# Patient Record
Sex: Male | Born: 1980 | Race: White | Hispanic: No | Marital: Single | Smoking: Former smoker
Health system: Southern US, Community
[De-identification: ages and names within clinical notes are randomized; demographics above are authoritative.]

## PROBLEM LIST (undated history)

## (undated) DIAGNOSIS — K219 Gastro-esophageal reflux disease without esophagitis: Secondary | ICD-10-CM

## (undated) HISTORY — DX: Gastro-esophageal reflux disease without esophagitis: K21.9

---

## 2013-12-02 HISTORY — PX: VAGINAL HYSTERECTOMY: SUR661

## 2014-06-22 DIAGNOSIS — N879 Dysplasia of cervix uteri, unspecified: Secondary | ICD-10-CM | POA: Insufficient documentation

## 2014-06-22 DIAGNOSIS — F649 Gender identity disorder, unspecified: Secondary | ICD-10-CM | POA: Insufficient documentation

## 2015-06-29 ENCOUNTER — Ambulatory Visit (INDEPENDENT_AMBULATORY_CARE_PROVIDER_SITE_OTHER): Payer: Self-pay | Admitting: Family Medicine

## 2015-06-29 ENCOUNTER — Encounter: Payer: Self-pay | Admitting: Family Medicine

## 2015-06-29 VITALS — BP 110/70 | HR 52 | Temp 98.1°F | Ht 66.0 in | Wt 152.0 lb

## 2015-06-29 DIAGNOSIS — H6501 Acute serous otitis media, right ear: Secondary | ICD-10-CM

## 2015-06-29 DIAGNOSIS — H6123 Impacted cerumen, bilateral: Secondary | ICD-10-CM

## 2015-06-29 DIAGNOSIS — H8309 Labyrinthitis, unspecified ear: Secondary | ICD-10-CM

## 2015-06-29 MED ORDER — AZITHROMYCIN 250 MG PO TABS: ORAL_TABLET | ORAL | Status: DC

## 2015-06-29 MED ORDER — MECLIZINE HCL 25 MG PO TABS: 25.0000 mg | ORAL_TABLET | Freq: Four times a day (QID) | ORAL | Status: AC

## 2015-06-29 NOTE — Progress Notes (Signed)
Name: Seth Conley   MRN: [id number]    DOB: 1981-06-30   Date:06/29/2015       Progress Note  Subjective  Chief Complaint  Chief Complaint  Patient presents with  . Dizziness    Dizziness This is a recurrent problem. The current episode started in the past 7 days. The problem occurs constantly. The problem has been unchanged. Associated symptoms include vertigo. Pertinent negatives include no abdominal pain, anorexia, arthralgias, change in bowel habit, chest pain, chills, congestion, coughing, diaphoresis, fatigue, fever, headaches, joint swelling, myalgias, nausea, neck pain, numbness, rash, sore throat, swollen glands, urinary symptoms, visual change, vomiting or weakness. The symptoms are aggravated by twisting and walking. He has tried nothing (otc dramanine) for the symptoms. The treatment provided mild (decreased nausea) relief.    No problem-specific assessment & plan notes found for this encounter.   No past medical history on file.  No past surgical history on file.  No family history on file.  History   Social History  . Marital Status: Unknown    Spouse Name: N/A  . Number of Children: N/A  . Years of Education: N/A   Occupational History  . Not on file.   Social History Main Topics  . Smoking status: Not on file  . Smokeless tobacco: Not on file  . Alcohol Use: Not on file  . Drug Use: Not on file  . Sexual Activity: Not on file   Other Topics Concern  . Not on file   Social History Narrative  . No narrative on file    Allergies  Allergen Reactions  . Amoxicillin Hives and Rash  . Hydrocodone Shortness Of Breath  . Sulfa Antibiotics Anaphylaxis  . Penicillins Rash     Review of Systems  Constitutional: Negative for fever, chills, weight loss, malaise/fatigue, diaphoresis and fatigue.  HENT: Negative for congestion, ear discharge, ear pain and sore throat.   Eyes: Negative for blurred vision.  Respiratory: Negative for cough, sputum  production, shortness of breath and wheezing.   Cardiovascular: Negative for chest pain, palpitations and leg swelling.  Gastrointestinal: Negative for heartburn, nausea, vomiting, abdominal pain, diarrhea, constipation, blood in stool, melena, anorexia and change in bowel habit.  Genitourinary: Negative for dysuria, urgency, frequency and hematuria.  Musculoskeletal: Negative for myalgias, back pain, joint pain, joint swelling, arthralgias and neck pain.  Skin: Negative for rash.  Neurological: Positive for dizziness and vertigo. Negative for tingling, sensory change, focal weakness, weakness, numbness and headaches.  Endo/Heme/Allergies: Negative for environmental allergies and polydipsia. Does not bruise/bleed easily.  Psychiatric/Behavioral: Negative for depression and suicidal ideas. The patient is not nervous/anxious and does not have insomnia.      Objective  Filed Vitals:   06/29/15 1129  BP: 110/70  Pulse: 52  Temp: 98.1 F (36.7 C)  TempSrc: Oral  Height:  (1.676 m)  Weight: 152 lb (68.947 kg)  SpO2: 99%    Physical Exam  Constitutional: He is oriented to person, place, and time and well-developed, well-nourished, and in no distress.  HENT:  Head: Normocephalic.  Right Ear: Hearing normal. There is tenderness. A foreign body is present. A middle ear effusion is present.  Left Ear: Hearing, tympanic membrane and external ear normal. A foreign body is present.  Nose: Nose normal.  Mouth/Throat: Oropharynx is clear and moist.  Eyes: Conjunctivae and EOM are normal. Pupils are equal, round, and reactive to light. Right eye exhibits no discharge. Left eye exhibits no discharge. No scleral icterus.  Neck:  Normal range of motion. Neck supple. No JVD present. No tracheal deviation present. No thyromegaly present.  Cardiovascular: Normal rate, regular rhythm, normal heart sounds and intact distal pulses.  Exam reveals no gallop and no friction rub.   No murmur  heard. Pulmonary/Chest: Breath sounds normal. No respiratory distress. He has no wheezes. He has no rales.  Abdominal: Soft. Bowel sounds are normal. He exhibits no mass. There is no hepatosplenomegaly. There is no tenderness. There is no rebound, no guarding and no CVA tenderness.  Musculoskeletal: Normal range of motion. He exhibits no edema or tenderness.  Lymphadenopathy:    He has no cervical adenopathy.  Neurological: He is alert and oriented to person, place, and time. He has normal sensation, normal strength, normal reflexes and intact cranial nerves. No cranial nerve deficit.  Skin: Skin is warm. No rash noted.  Psychiatric: Mood and affect normal.      Assessment & Plan  Problem List Items Addressed This Visit    None    Visit Diagnoses    Right acute serous otitis media, recurrence not specified    -  Primary    Relevant Medications    azithromycin (ZITHROMAX) 250 MG tablet    Labyrinthitis, unspecified laterality        Relevant Medications    meclizine (ANTIVERT) 25 MG tablet    Cerumen impaction, bilateral        irragated with results         Dr. Hayden [facility]  06/29/2015

## 2015-07-26 DIAGNOSIS — H81399 Other peripheral vertigo, unspecified ear: Secondary | ICD-10-CM | POA: Insufficient documentation

## 2015-09-22 ENCOUNTER — Other Ambulatory Visit: Payer: Self-pay

## 2015-10-05 ENCOUNTER — Encounter: Payer: Self-pay | Admitting: Family Medicine

## 2015-10-05 ENCOUNTER — Ambulatory Visit (INDEPENDENT_AMBULATORY_CARE_PROVIDER_SITE_OTHER): Payer: Self-pay | Admitting: Family Medicine

## 2015-10-05 VITALS — BP 120/70 | HR 68 | Ht 62.0 in | Wt 162.0 lb

## 2015-10-05 DIAGNOSIS — R69 Illness, unspecified: Secondary | ICD-10-CM

## 2015-10-05 DIAGNOSIS — Z79890 Hormone replacement therapy (postmenopausal): Secondary | ICD-10-CM

## 2015-10-05 LAB — GLUCOSE, POCT (MANUAL RESULT ENTRY): POC Glucose: 86 mg/dl (ref 70–99)

## 2015-10-05 MED ORDER — TESTOSTERONE CYPIONATE 100 MG/ML IM SOLN: 200.0000 mg | INTRAMUSCULAR | Status: DC

## 2015-10-05 NOTE — Progress Notes (Signed)
Name: Seth Conley   MRN: [id number]    DOB: 23-Jun-1981   Date:10/05/2015       Progress Note  Subjective  Chief Complaint  Chief Complaint  Patient presents with  . Annual Exam    F to M    Other This is a chronic (hormone replacement therpy) problem. The current episode started more than 1 year ago. The problem has been gradually improving. Pertinent negatives include no abdominal pain, anorexia, arthralgias, change in bowel habit, chest pain, chills, congestion, coughing, diaphoresis, fatigue, fever, headaches, joint swelling, myalgias, nausea, neck pain, numbness, rash, sore throat, swollen glands, urinary symptoms, vertigo, visual change, vomiting or weakness. Nothing aggravates the symptoms. Treatments tried: hormone replacement. The treatment provided moderate relief.    No problem-specific assessment & plan notes found for this encounter.   Past Medical History  Diagnosis Date  . GERD (gastroesophageal reflux disease)     Past Surgical History  Procedure Laterality Date  . Vaginal hysterectomy  2015    Family History  Problem Relation Age of Onset  . Heart disease Mother   . Cancer Sister   . Cancer Maternal Aunt   . Cancer Maternal Uncle     Social History   Social History  . Marital Status: Unknown    Spouse Name: N/A  . Number of Children: N/A  . Years of Education: N/A   Occupational History  . Not on file.   Social History Main Topics  . Smoking status: Former Games developer  . Smokeless tobacco: Not on file  . Alcohol Use: 0.0 oz/week    0 Standard drinks or equivalent per week  . Drug Use: No  . Sexual Activity: No   Other Topics Concern  . Not on file   Social History Narrative    Allergies  Allergen Reactions  . Amoxicillin Hives and Rash  . Hydrocodone Shortness Of Breath  . Sulfa Antibiotics Anaphylaxis  . Penicillins Rash     Review of Systems  Constitutional: Negative for fever, chills, weight loss, malaise/fatigue, diaphoresis and  fatigue.  HENT: Negative for congestion, ear discharge, ear pain and sore throat.        Headache resolved  Eyes: Negative for blurred vision.  Respiratory: Negative for cough, sputum production, shortness of breath and wheezing.   Cardiovascular: Negative for chest pain, palpitations and leg swelling.  Gastrointestinal: Negative for heartburn, nausea, vomiting, abdominal pain, diarrhea, constipation, blood in stool, melena, anorexia and change in bowel habit.  Genitourinary: Negative for dysuria, urgency, frequency and hematuria.  Musculoskeletal: Negative for myalgias, back pain, joint pain, joint swelling, arthralgias and neck pain.  Skin: Negative for rash.  Neurological: Negative for dizziness, vertigo, tingling, sensory change, focal weakness, weakness, numbness and headaches.  Endo/Heme/Allergies: Negative for environmental allergies and polydipsia. Does not bruise/bleed easily.  Psychiatric/Behavioral: Negative for depression and suicidal ideas. The patient is not nervous/anxious and does not have insomnia.      Objective  Filed Vitals:   10/05/15 1003  BP: 120/70  Pulse: 68  Height:  (1.575 m)  Weight: 162 lb (73.483 kg)    Physical Exam  Constitutional: He is oriented to person, place, and time and well-developed, well-nourished, and in no distress.  HENT:  Head: Normocephalic.  Right Ear: External ear normal.  Left Ear: External ear normal.  Nose: Nose normal.  Mouth/Throat: Oropharynx is clear and moist.  Eyes: Conjunctivae and EOM are normal. Pupils are equal, round, and reactive to light. Right eye exhibits no discharge. Left  eye exhibits no discharge. No scleral icterus.  Neck: Normal range of motion. Neck supple. No JVD present. No tracheal deviation present. No thyromegaly present.  Cardiovascular: Normal rate, regular rhythm, normal heart sounds and intact distal pulses.  Exam reveals no gallop and no friction rub.   No murmur heard. Pulmonary/Chest:  Breath sounds normal. No respiratory distress. He has no wheezes. He has no rales.  Abdominal: Soft. Bowel sounds are normal. He exhibits no mass. There is no hepatosplenomegaly. There is no tenderness. There is no rebound, no guarding and no CVA tenderness.  Musculoskeletal: Normal range of motion. He exhibits no edema or tenderness.  Lymphadenopathy:    He has no cervical adenopathy.  Neurological: He is alert and oriented to person, place, and time. He has normal sensation, normal strength, normal reflexes and intact cranial nerves. No cranial nerve deficit.  Skin: Skin is warm. No rash noted.  Psychiatric: Mood and affect normal.      Assessment & Plan  Problem List Items Addressed This Visit    None    Visit Diagnoses    Hormone replacement therapy (HRT)    -  Primary    Relevant Medications    testosterone cypionate (DEPOTESTOTERONE CYPIONATE) 100 MG/ML injection    Other Relevant Orders    AST    POCT Glucose (CBG)    Taking medication for chronic disease        Relevant Medications    testosterone cypionate (DEPOTESTOTERONE CYPIONATE) 100 MG/ML injection    Other Relevant Orders    AST    POCT Glucose (CBG)         Dr. Hayden Rasmusseneanna Aleksa [facility]  10/05/2015

## 2015-10-06 LAB — AST: AST: 18 IU/L (ref 0–40)

## 2016-02-06 ENCOUNTER — Encounter: Payer: Self-pay | Admitting: Family Medicine

## 2016-02-06 ENCOUNTER — Ambulatory Visit (INDEPENDENT_AMBULATORY_CARE_PROVIDER_SITE_OTHER): Payer: Self-pay | Admitting: Family Medicine

## 2016-02-06 VITALS — BP 100/72 | HR 78 | Ht 62.0 in | Wt 147.0 lb

## 2016-02-06 DIAGNOSIS — R69 Illness, unspecified: Secondary | ICD-10-CM

## 2016-02-06 DIAGNOSIS — Z79890 Hormone replacement therapy (postmenopausal): Secondary | ICD-10-CM

## 2016-02-06 MED ORDER — TESTOSTERONE CYPIONATE 100 MG/ML IM SOLN: 200.0000 mg | INTRAMUSCULAR | Status: DC

## 2016-02-06 NOTE — Progress Notes (Signed)
Name: Seth Conley   MRN: [id number]    DOB: 01/14/1981   Date:02/06/2016       Progress Note  Subjective  Chief Complaint  Chief Complaint  Patient presents with  . Hypogonadism    needs refill on testosterone    HPI Comments: Patient presents for hormone replacement therapy.     No problem-specific assessment & plan notes found for this encounter.   Past Medical History  Diagnosis Date  . GERD (gastroesophageal reflux disease)     Past Surgical History  Procedure Laterality Date  . Vaginal hysterectomy  2015    Family History  Problem Relation Age of Onset  . Heart disease Mother   . Cancer Sister   . Cancer Maternal Aunt   . Cancer Maternal Uncle     Social History   Social History  . Marital Status: Unknown    Spouse Name: N/A  . Number of Children: N/A  . Years of Education: N/A   Occupational History  . Not on file.   Social History Main Topics  . Smoking status: Former Games developer  . Smokeless tobacco: Not on file  . Alcohol Use: 0.0 oz/week    0 Standard drinks or equivalent per week  . Drug Use: No  . Sexual Activity: No   Other Topics Concern  . Not on file   Social History Narrative    Allergies  Allergen Reactions  . Amoxicillin Hives and Rash  . Hydrocodone Shortness Of Breath  . Sulfa Antibiotics Anaphylaxis  . Penicillins Rash     Review of Systems  Constitutional: Negative for fever, chills, weight loss and malaise/fatigue.  HENT: Negative for ear discharge, ear pain and sore throat.   Eyes: Negative for blurred vision.  Respiratory: Negative for cough, sputum production, shortness of breath and wheezing.   Cardiovascular: Negative for chest pain, palpitations and leg swelling.  Gastrointestinal: Negative for heartburn, nausea, abdominal pain, diarrhea, constipation, blood in stool and melena.  Genitourinary: Negative for dysuria, urgency, frequency and hematuria.  Musculoskeletal: Negative for myalgias, back pain, joint pain and  neck pain.  Skin: Negative for rash.  Neurological: Negative for dizziness, tingling, sensory change, focal weakness and headaches.  Endo/Heme/Allergies: Negative for environmental allergies and polydipsia. Does not bruise/bleed easily.  Psychiatric/Behavioral: Negative for depression and suicidal ideas. The patient is not nervous/anxious and does not have insomnia.      Objective  Filed Vitals:   02/06/16 1104  BP: 100/72  Pulse: 78  Height:  (1.575 m)  Weight: 147 lb (66.679 kg)    Physical Exam  Constitutional: He is oriented to person, place, and time and well-developed, well-nourished, and in no distress.  HENT:  Head: Normocephalic.  Right Ear: External ear normal.  Left Ear: External ear normal.  Nose: Nose normal.  Mouth/Throat: Oropharynx is clear and moist.  Eyes: Conjunctivae and EOM are normal. Pupils are equal, round, and reactive to light. Right eye exhibits no discharge. Left eye exhibits no discharge. No scleral icterus.  Neck: Normal range of motion. Neck supple. No JVD present. No tracheal deviation present. No thyromegaly present.  Cardiovascular: Normal rate, regular rhythm, normal heart sounds and intact distal pulses.  Exam reveals no gallop and no friction rub.   No murmur heard. Pulmonary/Chest: Breath sounds normal. No respiratory distress. He has no wheezes. He has no rales.  Abdominal: Soft. Bowel sounds are normal. He exhibits no mass. There is no hepatosplenomegaly. There is no tenderness. There is no rebound, no guarding  and no CVA tenderness.  Musculoskeletal: Normal range of motion. He exhibits no edema or tenderness.  Lymphadenopathy:    He has no cervical adenopathy.  Neurological: He is alert and oriented to person, place, and time. He has normal sensation, normal strength, normal reflexes and intact cranial nerves. No cranial nerve deficit.  Skin: Skin is warm. No rash noted.  Psychiatric: Mood and affect normal.  Nursing note and vitals  reviewed.     Assessment & Plan  Problem List Items Addressed This Visit    None    Visit Diagnoses    Hormone replacement therapy (HRT)    -  Primary    Relevant Medications    testosterone cypionate (DEPOTESTOTERONE CYPIONATE) 100 MG/ML injection    Taking medication for chronic disease        Relevant Medications    testosterone cypionate (DEPOTESTOTERONE CYPIONATE) 100 MG/ML injection         Dr. Hayden Rasmusseneanna Kiaan Overholser [facility]  02/06/2016

## 2016-02-07 ENCOUNTER — Ambulatory Visit: Payer: Self-pay | Admitting: Family Medicine

## 2016-08-20 ENCOUNTER — Other Ambulatory Visit: Payer: Self-pay

## 2017-01-03 ENCOUNTER — Ambulatory Visit (INDEPENDENT_AMBULATORY_CARE_PROVIDER_SITE_OTHER): Payer: BLUE CROSS/BLUE SHIELD | Admitting: Family Medicine

## 2017-01-03 ENCOUNTER — Ambulatory Visit
Admission: RE | Admit: 2017-01-03 | Discharge: 2017-01-03 | Disposition: A | Discharge status: Home / Self Care | Payer: BLUE CROSS/BLUE SHIELD | Source: Ambulatory Visit | Attending: Family Medicine | Admitting: Family Medicine

## 2017-01-03 VITALS — BP 100/62 | HR 50 | Ht 62.0 in | Wt 161.0 lb

## 2017-01-03 DIAGNOSIS — R69 Illness, unspecified: Secondary | ICD-10-CM

## 2017-01-03 DIAGNOSIS — E291 Testicular hypofunction: Secondary | ICD-10-CM | POA: Diagnosis not present

## 2017-01-03 DIAGNOSIS — M5020 Other cervical disc displacement, unspecified cervical region: Secondary | ICD-10-CM

## 2017-01-03 DIAGNOSIS — E782 Mixed hyperlipidemia: Secondary | ICD-10-CM | POA: Diagnosis not present

## 2017-01-03 DIAGNOSIS — M5410 Radiculopathy, site unspecified: Secondary | ICD-10-CM

## 2017-01-03 DIAGNOSIS — Z23 Encounter for immunization: Secondary | ICD-10-CM | POA: Diagnosis not present

## 2017-01-03 DIAGNOSIS — Z79890 Hormone replacement therapy (postmenopausal): Secondary | ICD-10-CM | POA: Diagnosis not present

## 2017-01-03 DIAGNOSIS — M5030 Other cervical disc degeneration, unspecified cervical region: Secondary | ICD-10-CM

## 2017-01-03 MED ORDER — TESTOSTERONE CYPIONATE 100 MG/ML IM SOLN: 200.0000 mg | INTRAMUSCULAR | 2 refills | Status: DC

## 2017-01-03 MED ORDER — OMEGA-3 1000 MG PO CAPS: 1.0000 | ORAL_CAPSULE | Freq: Two times a day (BID) | ORAL | 3 refills | Status: DC

## 2017-01-03 MED ORDER — ASPIRIN EC 81 MG PO TBEC: 81.0000 mg | DELAYED_RELEASE_TABLET | Freq: Every day | ORAL | 3 refills | Status: DC

## 2017-01-03 NOTE — Progress Notes (Signed)
Name: Seth Conley   MRN: [id number]    DOB: 1980/12/15   Date:01/03/2017       Progress Note  Subjective  Chief Complaint  Chief Complaint  Patient presents with  . hormone replacement therapy    Patient present for endocrine disorder.   Neck Pain   This is a chronic problem. The current episode started more than 1 year ago. The problem occurs intermittently. The problem has been waxing and waning. The pain is associated with a remote injury. The pain is present in the midline. The quality of the pain is described as aching. The pain is at a severity of 4/10. Pertinent negatives include no chest pain, fever, headaches, leg pain, numbness, pain with swallowing, paresis, photophobia, syncope, tingling, trouble swallowing, visual change, weakness or weight loss. He has tried nothing for the symptoms. The treatment provided no relief.    No problem-specific Assessment & Plan notes found for this encounter.   Past Medical History:  Diagnosis Date  . GERD (gastroesophageal reflux disease)     Past Surgical History:  Procedure Laterality Date  . VAGINAL HYSTERECTOMY  2015    Family History  Problem Relation Age of Onset  . Heart disease Mother   . Cancer Sister   . Cancer Maternal Aunt   . Cancer Maternal Uncle     Social History   Social History  . Marital status: Single    Spouse name: N/A  . Number of children: N/A  . Years of education: N/A   Occupational History  . Not on file.   Social History Main Topics  . Smoking status: Former Games developer  . Smokeless tobacco: Not on file  . Alcohol use 0.0 oz/week  . Drug use: No  . Sexual activity: No   Other Topics Concern  . Not on file   Social History Narrative  . No narrative on file    Allergies  Allergen Reactions  . Amoxicillin Hives and Rash  . Hydrocodone Shortness Of Breath  . Sulfa Antibiotics Anaphylaxis  . Clindamycin Hives  . Penicillins Rash     Review of Systems  Constitutional: Negative  for chills, fever, malaise/fatigue and weight loss.  HENT: Negative for ear discharge, ear pain, sore throat and trouble swallowing.   Eyes: Negative for blurred vision and photophobia.  Respiratory: Negative for cough, sputum production, shortness of breath and wheezing.   Cardiovascular: Negative for chest pain, palpitations, claudication, leg swelling and syncope.  Gastrointestinal: Negative for abdominal pain, blood in stool, constipation, diarrhea, heartburn, melena and nausea.  Genitourinary: Negative for dysuria, frequency, hematuria and urgency.  Musculoskeletal: Positive for neck pain. Negative for back pain, joint pain and myalgias.  Skin: Negative for rash.  Neurological: Negative for dizziness, tingling, tremors, sensory change, focal weakness, weakness, numbness and headaches.  Endo/Heme/Allergies: Negative for environmental allergies and polydipsia. Does not bruise/bleed easily.  Psychiatric/Behavioral: Negative for depression and suicidal ideas. The patient is not nervous/anxious and does not have insomnia.      Objective  Vitals:   01/03/17 0845  BP: 100/62  Pulse: (!) 50  Weight: 161 lb (73 kg)  Height: 5\' 2"  (1.575 m)    Physical Exam  Nursing note and vitals reviewed.     Assessment & Plan  Problem List Items Addressed This Visit    None    Visit Diagnoses    Hypogonadism in male    -  Primary   Relevant Orders   Testosterone   Cervical discogenic pain syndrome  Relevant Orders   DG Cervical Spine Complete   Radiculopathy affecting upper extremity       Hormone replacement therapy (HRT)       Relevant Medications   testosterone cypionate (DEPOTESTOTERONE CYPIONATE) 100 MG/ML injection   Taking medication for chronic disease       Relevant Medications   testosterone cypionate (DEPOTESTOTERONE CYPIONATE) 100 MG/ML injection   aspirin EC 81 MG tablet   Other Relevant Orders   Testosterone   Mixed hyperlipidemia       Relevant Medications    Omega-3 1000 MG CAPS   aspirin EC 81 MG tablet   Immunization due            Dr. Hayden Rasmusseneanna Cynthia Stainback [facility]  01/03/17

## 2017-01-04 LAB — TESTOSTERONE: Testosterone: 412 ng/dL (ref 264–916)

## 2017-01-06 ENCOUNTER — Other Ambulatory Visit: Payer: Self-pay

## 2017-01-23 DIAGNOSIS — M25562 Pain in left knee: Secondary | ICD-10-CM | POA: Insufficient documentation

## 2017-02-06 ENCOUNTER — Other Ambulatory Visit: Payer: Self-pay

## 2017-02-07 ENCOUNTER — Other Ambulatory Visit: Payer: Self-pay

## 2017-09-29 ENCOUNTER — Encounter: Payer: Self-pay | Admitting: Family Medicine

## 2017-09-29 ENCOUNTER — Ambulatory Visit (INDEPENDENT_AMBULATORY_CARE_PROVIDER_SITE_OTHER): Payer: BLUE CROSS/BLUE SHIELD | Admitting: Family Medicine

## 2017-09-29 VITALS — BP 120/70 | HR 68 | Ht 62.0 in | Wt 162.0 lb

## 2017-09-29 DIAGNOSIS — R69 Illness, unspecified: Secondary | ICD-10-CM | POA: Diagnosis not present

## 2017-09-29 DIAGNOSIS — E349 Endocrine disorder, unspecified: Secondary | ICD-10-CM

## 2017-09-29 DIAGNOSIS — Z79890 Hormone replacement therapy (postmenopausal): Secondary | ICD-10-CM

## 2017-09-29 DIAGNOSIS — Z23 Encounter for immunization: Secondary | ICD-10-CM | POA: Diagnosis not present

## 2017-09-29 MED ORDER — TESTOSTERONE CYPIONATE 100 MG/ML IM SOLN: 200.0000 mg | INTRAMUSCULAR | 1 refills | Status: DC

## 2017-09-29 NOTE — Progress Notes (Signed)
Name: Seth Conley   MRN: [id number]    DOB: 29-Jul-1981   Date:09/29/2017       Progress Note  Subjective  Chief Complaint  Chief Complaint  Patient presents with  . hormone replacement therapy    does not need syringes and needles    Patient returns for hormone replacement therapy.    No problem-specific Assessment & Plan notes found for this encounter.   Past Medical History:  Diagnosis Date  . GERD (gastroesophageal reflux disease)     Past Surgical History:  Procedure Laterality Date  . VAGINAL HYSTERECTOMY  2015    Family History  Problem Relation Age of Onset  . Heart disease Mother   . Cancer Sister   . Cancer Maternal Aunt   . Cancer Maternal Uncle     Social History   Social History  . Marital status: Single    Spouse name: N/A  . Number of children: N/A  . Years of education: N/A   Occupational History  . Not on file.   Social History Main Topics  . Smoking status: Current Every Day Smoker    Types: E-cigarettes  . Smokeless tobacco: Never Used  . Alcohol use 0.0 oz/week  . Drug use: No  . Sexual activity: No   Other Topics Concern  . Not on file   Social History Narrative  . No narrative on file    Allergies  Allergen Reactions  . Amoxicillin Hives and Rash  . Hydrocodone Shortness Of Breath  . Sulfa Antibiotics Anaphylaxis  . Clindamycin Hives  . Penicillins Rash    Outpatient Medications Prior to Visit  Medication Sig Dispense Refill  . aspirin EC 81 MG tablet Take 1 tablet (81 mg total) by mouth daily. Reported on 02/06/2016 90 tablet 3  . Turmeric, Curcuma Longa, POWD 1 scoop by Does not apply route daily.    Marland Kitchen testosterone cypionate (DEPOTESTOTERONE CYPIONATE) 100 MG/ML injection Inject 2 mLs (200 mg total) into the muscle every 14 (fourteen) days. Inject one half cc every week (Patient taking differently: Inject 200 mg into the muscle every 14 (fourteen) days. * pt taking- Inject one half cc every week*) 10 mL 2  .  Omega-3 1000 MG CAPS Take 1 capsule (1,000 mg total) by mouth 2 (two) times daily. Reported on 02/06/2016 (Patient not taking: Reported on 09/29/2017) 180 capsule 3  . Amino Acids (AMINO ACID PO) Take 1 tablet by mouth 2 (two) times daily. otc     No facility-administered medications prior to visit.     Review of Systems  Constitutional: Negative for chills, fever, malaise/fatigue and weight loss.  HENT: Negative for ear discharge, ear pain and sore throat.   Eyes: Negative for blurred vision.  Respiratory: Negative for cough, sputum production, shortness of breath and wheezing.   Cardiovascular: Negative for chest pain, palpitations and leg swelling.  Gastrointestinal: Negative for abdominal pain, blood in stool, constipation, diarrhea, heartburn, melena and nausea.  Genitourinary: Negative for dysuria, frequency, hematuria and urgency.  Musculoskeletal: Negative for back pain, joint pain, myalgias and neck pain.  Skin: Negative for rash.  Neurological: Negative for dizziness, tingling, sensory change, focal weakness and headaches.  Endo/Heme/Allergies: Negative for environmental allergies and polydipsia. Does not bruise/bleed easily.  Psychiatric/Behavioral: Negative for depression and suicidal ideas. The patient is not nervous/anxious and does not have insomnia.      Objective  Vitals:   09/29/17 1427  BP: 120/70  Pulse: 68  Weight: 162 lb (73.5 kg)  Height:  5\' 2"  (1.575 m)    Physical Exam  Constitutional: He is oriented to person, place, and time and well-developed, well-nourished, and in no distress.  HENT:  Head: Normocephalic.  Right Ear: External ear normal.  Left Ear: External ear normal.  Nose: Nose normal.  Mouth/Throat: Oropharynx is clear and moist.  Eyes: Pupils are equal, round, and reactive to light. Conjunctivae and EOM are normal. Right eye exhibits no discharge. Left eye exhibits no discharge. No scleral icterus.  Neck: Normal range of motion. Neck supple.  No JVD present. No tracheal deviation present. No thyromegaly present.  Cardiovascular: Normal rate, regular rhythm, normal heart sounds and intact distal pulses.  Exam reveals no gallop and no friction rub.   No murmur heard. Pulmonary/Chest: Breath sounds normal. No respiratory distress. He has no wheezes. He has no rales.  Abdominal: Soft. Bowel sounds are normal. He exhibits no mass. There is no hepatosplenomegaly. There is no tenderness. There is no rebound, no guarding and no CVA tenderness.  Musculoskeletal: Normal range of motion. He exhibits no edema or tenderness.  Lymphadenopathy:    He has no cervical adenopathy.  Neurological: He is alert and oriented to person, place, and time. He has normal sensation, normal strength, normal reflexes and intact cranial nerves. No cranial nerve deficit.  Skin: Skin is warm. No rash noted.  Psychiatric: Mood and affect normal.  Nursing note and vitals reviewed.     Assessment & Plan  Problem List Items Addressed This Visit    None    Visit Diagnoses    Hormone replacement therapy    -  Primary   Relevant Medications   testosterone cypionate (DEPOTESTOTERONE CYPIONATE) 100 MG/ML injection   Other Relevant Orders   Renal Function Panel   Hepatic Function Panel (6)   Endocrine disorder       Relevant Orders   Renal Function Panel   Hormone replacement therapy (HRT)       Relevant Medications   testosterone cypionate (DEPOTESTOTERONE CYPIONATE) 100 MG/ML injection   Taking medication for chronic disease       Relevant Medications   testosterone cypionate (DEPOTESTOTERONE CYPIONATE) 100 MG/ML injection   Other Relevant Orders   Hepatic Function Panel (6)   Flu vaccine need       Relevant Orders   Flu Vaccine QUAD 6+ mos PF IM (Fluarix Quad PF) (Completed)      Meds ordered this encounter  Medications  . testosterone cypionate (DEPOTESTOTERONE CYPIONATE) 100 MG/ML injection    Sig: Inject 2 mLs (200 mg total) into the muscle  every 14 (fourteen) days. * pt taking- Inject one half cc every week"    Dispense:  10 mL    Refill:  1      Dr. Hayden Rasmusseneanna Jones [facility]  09/29/17

## 2017-09-30 LAB — RENAL FUNCTION PANEL
Albumin: 5 g/dL (ref 3.5–5.5)
BUN/Creatinine Ratio: 15 (ref 9–20)
BUN: 14 mg/dL (ref 6–20)
CO2: 24 mmol/L (ref 20–29)
Calcium: 9.7 mg/dL (ref 8.7–10.2)
Chloride: 101 mmol/L (ref 96–106)
Creatinine, Ser: 0.94 mg/dL (ref 0.76–1.27)
GFR calc Af Amer: 121 mL/min/{1.73_m2} (ref >59)
GFR calc non Af Amer: 105 mL/min/{1.73_m2} (ref >59)
Glucose: 55 mg/dL — ABNORMAL LOW (ref 65–99)
Phosphorus: 4.1 mg/dL (ref 2.5–4.5)
Potassium: 4.3 mmol/L (ref 3.5–5.2)
Sodium: 141 mmol/L (ref 134–144)

## 2017-09-30 LAB — HEPATIC FUNCTION PANEL (6)
ALT: 23 IU/L (ref 0–44)
AST: 16 IU/L (ref 0–40)
Alkaline Phosphatase: 47 IU/L (ref 39–117)
Bilirubin Total: 0.2 mg/dL (ref 0.0–1.2)
Bilirubin, Direct: 0.09 mg/dL (ref 0.00–0.40)

## 2017-11-06 ENCOUNTER — Other Ambulatory Visit: Payer: Self-pay | Admitting: Family Medicine

## 2017-11-06 DIAGNOSIS — Z79890 Hormone replacement therapy (postmenopausal): Secondary | ICD-10-CM

## 2017-11-06 DIAGNOSIS — R69 Illness, unspecified: Secondary | ICD-10-CM

## 2017-11-18 ENCOUNTER — Other Ambulatory Visit: Payer: Self-pay

## 2017-12-23 ENCOUNTER — Other Ambulatory Visit: Payer: Self-pay

## 2018-03-30 ENCOUNTER — Other Ambulatory Visit: Payer: Self-pay

## 2018-03-30 ENCOUNTER — Ambulatory Visit: Payer: BLUE CROSS/BLUE SHIELD | Admitting: Family Medicine

## 2018-07-14 ENCOUNTER — Ambulatory Visit (INDEPENDENT_AMBULATORY_CARE_PROVIDER_SITE_OTHER): Payer: Self-pay | Admitting: Family Medicine

## 2018-07-14 ENCOUNTER — Ambulatory Visit: Payer: Self-pay | Admitting: Family Medicine

## 2018-07-14 ENCOUNTER — Encounter: Payer: Self-pay | Admitting: Family Medicine

## 2018-07-14 VITALS — BP 130/80 | HR 80 | Ht 62.0 in | Wt 161.0 lb

## 2018-07-14 DIAGNOSIS — R69 Illness, unspecified: Secondary | ICD-10-CM

## 2018-07-14 DIAGNOSIS — Z79890 Hormone replacement therapy (postmenopausal): Secondary | ICD-10-CM

## 2018-07-14 DIAGNOSIS — F17200 Nicotine dependence, unspecified, uncomplicated: Secondary | ICD-10-CM

## 2018-07-14 MED ORDER — TESTOSTERONE CYPIONATE 100 MG/ML IM SOLN: 200.0000 mg | INTRAMUSCULAR | 1 refills | Status: DC

## 2018-07-14 NOTE — Progress Notes (Signed)
Name: Seth Conley   MRN: [id number]    DOB: [birth year]   Date:07/14/2018       Progress Note  Subjective  Chief Complaint  Chief Complaint  Patient presents with  . hormone replacement therapy    Patient is a 37 year old male who presents for a transgender maintenance exam. The patient reports the following problems: none. Health maintenance has been reviewed up to date.   Hormone replacement therapy (HRT) Gender affirmation hormone therapy /refill testosterone cypionate 200 mg/cc/ 1 half cc q week.   Past Medical History:  Diagnosis Date  . GERD (gastroesophageal reflux disease)     Past Surgical History:  Procedure Laterality Date  . VAGINAL HYSTERECTOMY  2015    Family History  Problem Relation Age of Onset  . Heart disease Mother   . Cancer Sister   . Cancer Maternal Aunt   . Cancer Maternal Uncle     Social History   Socioeconomic History  . Marital status: Single    Spouse name: Not on file  . Number of children: Not on file  . Years of education: Not on file  . Highest education level: Not on file  Occupational History  . Not on file  Social Needs  . Financial resource strain: Not on file  . Food insecurity:    Worry: Not on file    Inability: Not on file  . Transportation needs:    Medical: Not on file    Non-medical: Not on file  Tobacco Use  . Smoking status: Current Every Day Smoker    Types: E-cigarettes  . Smokeless tobacco: Never Used  . Tobacco comment: discussed pills and patches  Substance and Sexual Activity  . Alcohol use: Yes    Alcohol/week: 0.0 standard drinks  . Drug use: No  . Sexual activity: Never  Lifestyle  . Physical activity:    Days per week: Not on file    Minutes per session: Not on file  . Stress: Not on file  Relationships  . Social connections:    Talks on phone: Not on file    Gets together: Not on file    Attends religious service: Not on file    Active member of club or organization: Not on file     Attends meetings of clubs or organizations: Not on file    Relationship status: Not on file  . Intimate partner violence:    Fear of current or ex partner: Not on file    Emotionally abused: Not on file    Physically abused: Not on file    Forced sexual activity: Not on file  Other Topics Concern  . Not on file  Social History Narrative  . Not on file    Allergies  Allergen Reactions  . Amoxicillin Hives and Rash  . Hydrocodone Shortness Of Breath  . Sulfa Antibiotics Anaphylaxis  . Clindamycin Hives  . Penicillins Rash    Outpatient Medications Prior to Visit  Medication Sig Dispense Refill  . aspirin EC 81 MG tablet Take 1 tablet (81 mg total) by mouth daily. Reported on 02/06/2016 90 tablet 3  . Omega-3 1000 MG CAPS Take 1 capsule (1,000 mg total) by mouth 2 (two) times daily. Reported on 02/06/2016 180 capsule 3  . Turmeric, Curcuma Longa, POWD 1 scoop by Does not apply route daily.    Marland Kitchen testosterone cypionate (DEPOTESTOTERONE CYPIONATE) 100 MG/ML injection Inject 2 mLs (200 mg total) into the muscle every 14 (fourteen) days. *  pt taking- Inject one half cc every week" 10 mL 1  . DOCOSAHEXAENOIC ACID PO Take by mouth.    . naproxen (NAPROSYN) 500 MG tablet Take by mouth.    . simethicone (MYLICON) 80 MG chewable tablet Take 2-4 times per day for abdominal cramping/bloating     No facility-administered medications prior to visit.     Review of Systems  Constitutional: Negative for chills, fever, malaise/fatigue and weight loss.  HENT: Negative for ear discharge, ear pain and sore throat.   Eyes: Negative for blurred vision.  Respiratory: Negative for cough, sputum production, shortness of breath and wheezing.   Cardiovascular: Negative for chest pain, palpitations and leg swelling.  Gastrointestinal: Negative for abdominal pain, blood in stool, constipation, diarrhea, heartburn, melena and nausea.  Genitourinary: Negative for dysuria, frequency, hematuria and urgency.   Musculoskeletal: Negative for back pain, joint pain, myalgias and neck pain.  Skin: Negative for rash.  Neurological: Negative for dizziness, tingling, sensory change, focal weakness and headaches.  Endo/Heme/Allergies: Negative for environmental allergies and polydipsia. Does not bruise/bleed easily.  Psychiatric/Behavioral: Negative for depression and suicidal ideas. The patient is not nervous/anxious and does not have insomnia.      Objective  Vitals:   07/14/18 0912  BP: 130/80  Pulse: 80  Weight: 161 lb (73 kg)  Height: 5\' 2"  (1.575 m)    Physical Exam  Constitutional: He is oriented to person, place, and time.  HENT:  Head: Normocephalic.  Right Ear: External ear normal.  Left Ear: External ear normal.  Nose: Nose normal.  Mouth/Throat: Oropharynx is clear and moist.  Eyes: Pupils are equal, round, and reactive to light. Conjunctivae and EOM are normal. Right eye exhibits no discharge. Left eye exhibits no discharge. No scleral icterus.  Neck: Normal range of motion. Neck supple. No JVD present. No tracheal deviation present. No thyromegaly present.  Cardiovascular: Normal rate, regular rhythm, normal heart sounds and intact distal pulses. Exam reveals no gallop and no friction rub.  No murmur heard. Pulmonary/Chest: Breath sounds normal. No respiratory distress. He has no wheezes. He has no rales.  Abdominal: Soft. Bowel sounds are normal. He exhibits no mass. There is no hepatosplenomegaly. There is no tenderness. There is no rebound, no guarding and no CVA tenderness.  Musculoskeletal: Normal range of motion. He exhibits no edema or tenderness.  Lymphadenopathy:    He has no cervical adenopathy.  Neurological: He is alert and oriented to person, place, and time. He has normal strength and normal reflexes. No cranial nerve deficit.  Skin: Skin is warm. No rash noted.  Nursing note and vitals reviewed.     Assessment & Plan  Problem List Items Addressed This Visit       Other   Hormone replacement therapy (HRT) - Primary    Gender affirmation hormone therapy /refill testosterone cypionate 200 mg/cc/ 1 half cc q week.      Relevant Medications   testosterone cypionate (DEPOTESTOTERONE CYPIONATE) 100 MG/ML injection    Other Visit Diagnoses    Tobacco dependence       discussed nicotine cessation.   Taking medication for chronic disease       Relevant Medications   testosterone cypionate (DEPOTESTOTERONE CYPIONATE) 100 MG/ML injection      Meds ordered this encounter  Medications  . testosterone cypionate (DEPOTESTOTERONE CYPIONATE) 100 MG/ML injection    Sig: Inject 2 mLs (200 mg total) into the muscle every 14 (fourteen) days. * pt taking- Inject one half cc every week"  Dispense:  10 mL    Refill:  1  Patient has been advised of the health risks of smoking and counseled concerning cessation of tobacco products. I spent over 3 minutes for discussion and to answer questions.    Dr. Hayden Rasmusseneanna Brihana Quickel [facility]  07/14/18

## 2018-07-14 NOTE — Assessment & Plan Note (Signed)
Gender affirmation hormone therapy /refill testosterone cypionate 200 mg/cc/ 1 half cc q week.

## 2018-09-03 ENCOUNTER — Telehealth: Payer: Self-pay

## 2018-09-03 ENCOUNTER — Other Ambulatory Visit: Payer: Self-pay

## 2018-09-03 DIAGNOSIS — Z79890 Hormone replacement therapy (postmenopausal): Secondary | ICD-10-CM

## 2018-09-03 DIAGNOSIS — R69 Illness, unspecified: Secondary | ICD-10-CM

## 2018-09-03 MED ORDER — TESTOSTERONE CYPIONATE 100 MG/ML IM SOLN: 200.0000 mg | INTRAMUSCULAR | 1 refills | Status: DC

## 2018-09-03 NOTE — Telephone Encounter (Signed)
100mg /ml is on backorder- needs to change to 200mg /ml- take 1/2 cc every 14 days

## 2018-09-14 IMAGING — CR DG CERVICAL SPINE COMPLETE 4+V
6 series · 6 of 6 positions shown · non-contrast
Comparison: None.

CLINICAL DATA: Chronic neck pain 2 years, worsening the past few
months

EXAM:
CERVICAL SPINE - COMPLETE 4+ VIEW

[c-spine lat]
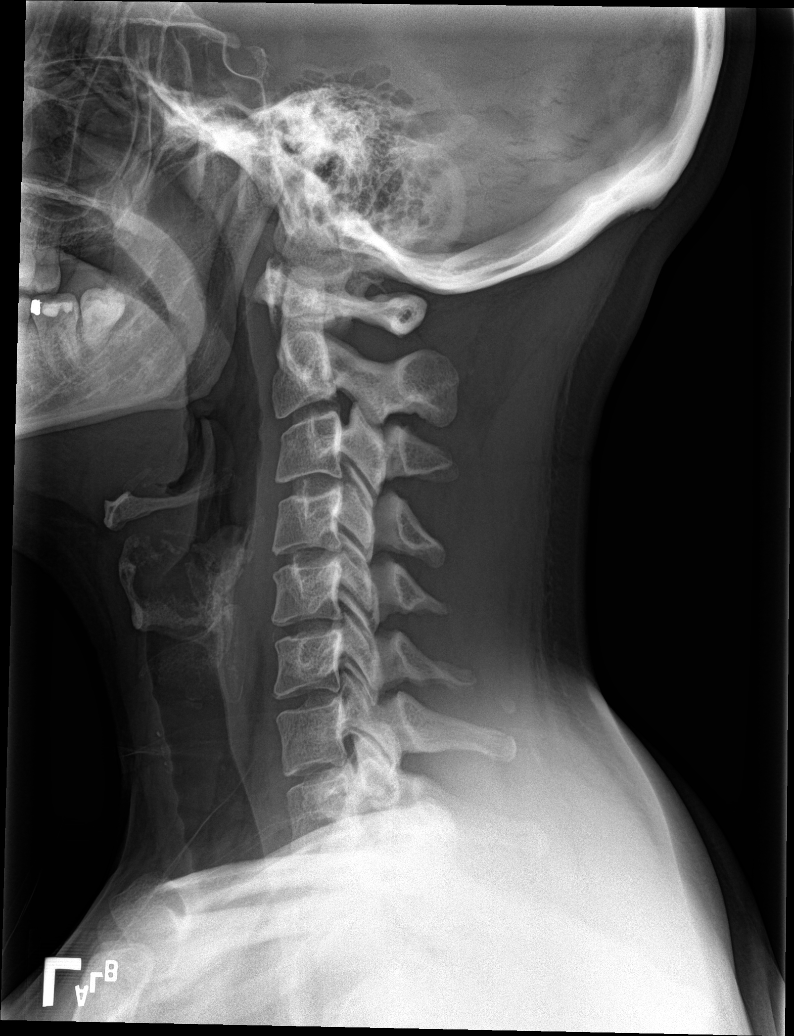

[c-spine obl (1 of 2)]
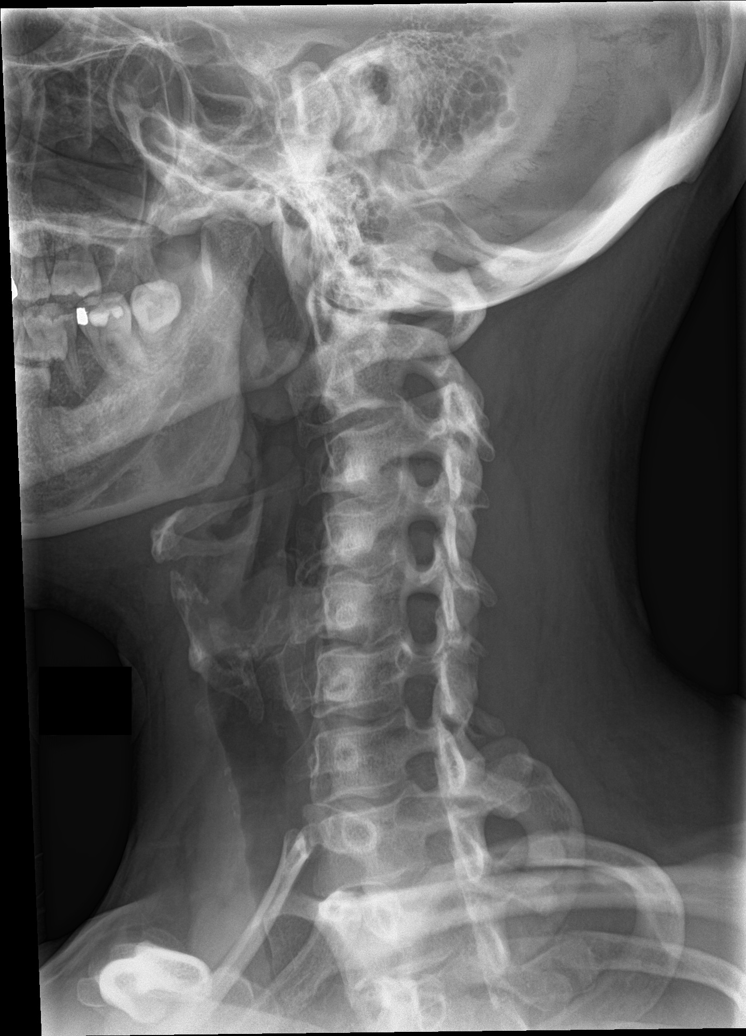

[c-spine obl (2 of 2)]
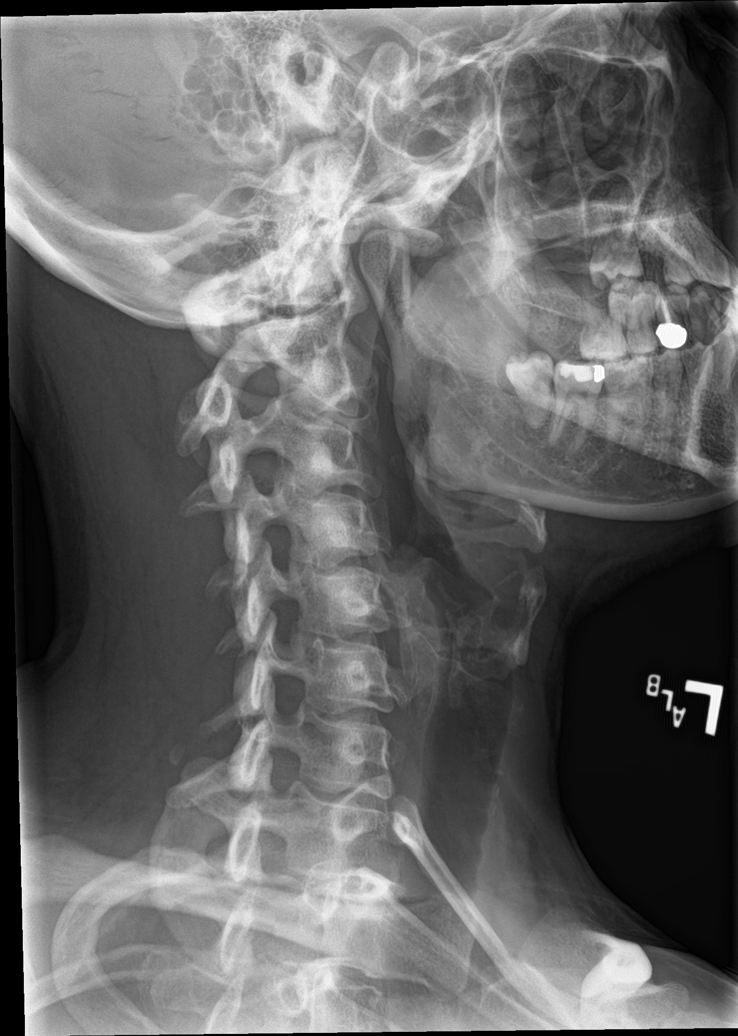

[c-spine ap]
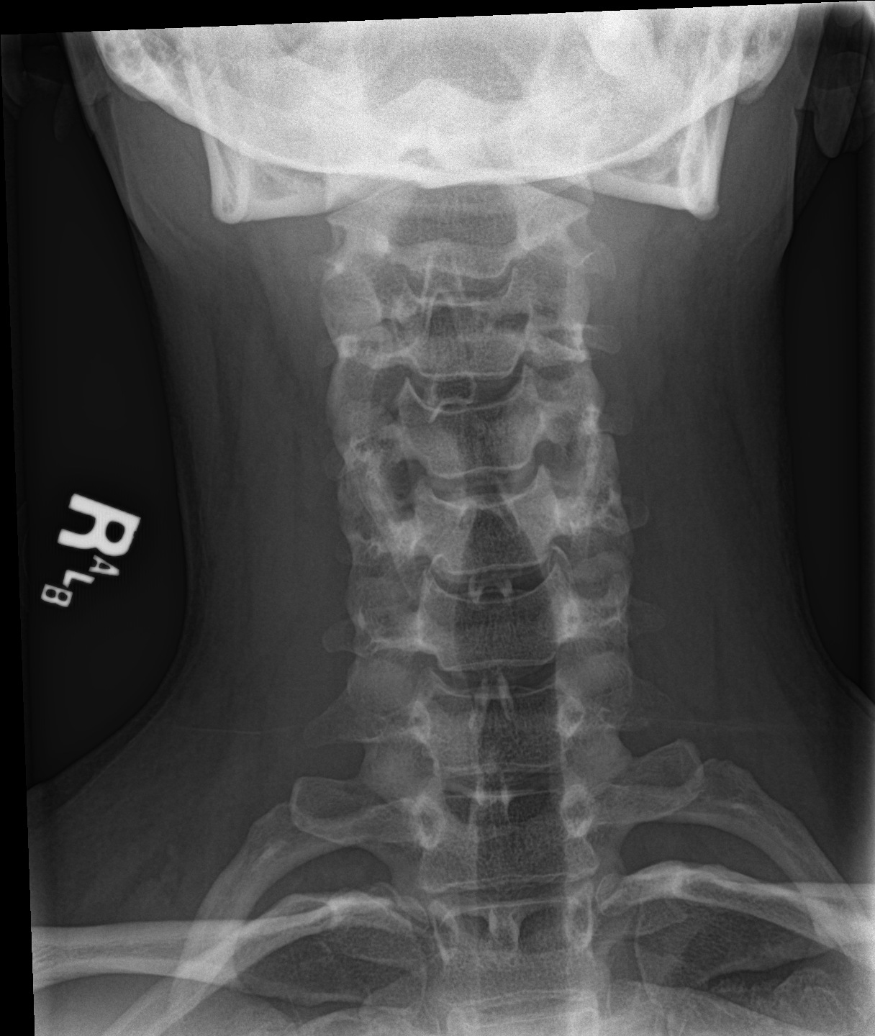

[c-spine open mouth]
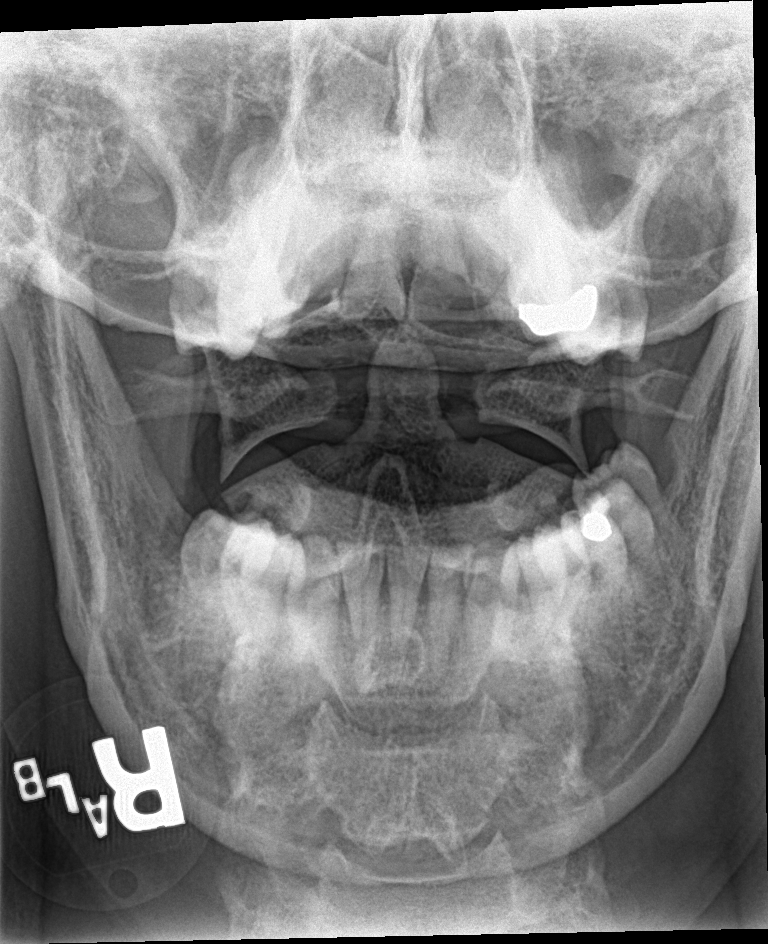

[[person_name]]
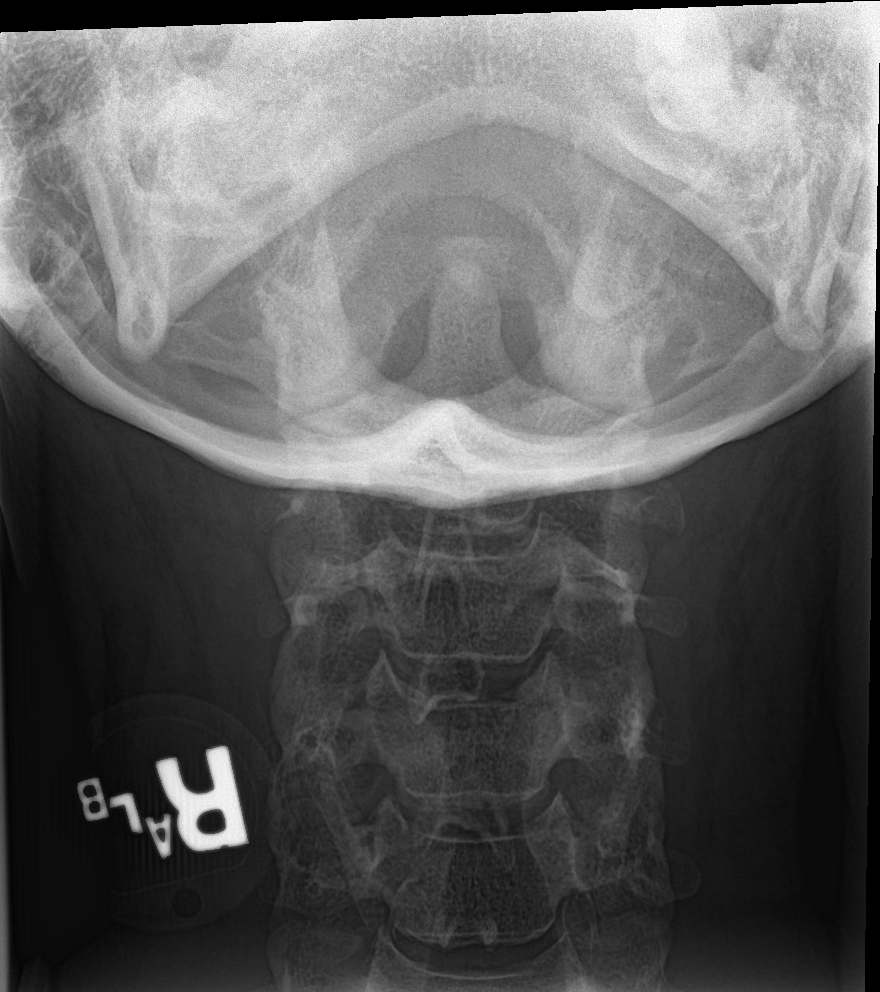

[6 of 6 positions shown; findings below may reference images not displayed]

FINDINGS: There is no evidence of cervical spine fracture or prevertebral soft
tissue swelling. Alignment is normal. No other significant bone
abnormalities are identified.
IMPRESSION: Negative cervical spine radiographs.

## 2018-10-06 ENCOUNTER — Other Ambulatory Visit: Payer: Self-pay

## 2018-10-06 DIAGNOSIS — R69 Illness, unspecified: Secondary | ICD-10-CM

## 2018-10-06 DIAGNOSIS — Z79890 Hormone replacement therapy (postmenopausal): Secondary | ICD-10-CM

## 2018-12-07 ENCOUNTER — Other Ambulatory Visit: Payer: Self-pay | Admitting: Family Medicine

## 2019-03-18 ENCOUNTER — Other Ambulatory Visit: Payer: Self-pay

## 2019-03-18 ENCOUNTER — Encounter: Payer: Self-pay | Admitting: Family Medicine

## 2019-03-18 ENCOUNTER — Ambulatory Visit (INDEPENDENT_AMBULATORY_CARE_PROVIDER_SITE_OTHER): Payer: Self-pay | Admitting: Family Medicine

## 2019-03-18 VITALS — BP 120/70 | HR 72 | Ht 62.0 in | Wt 165.0 lb

## 2019-03-18 DIAGNOSIS — Z79890 Hormone replacement therapy (postmenopausal): Secondary | ICD-10-CM

## 2019-03-18 MED ORDER — TESTOSTERONE CYPIONATE 200 MG/ML IJ SOLN: 0.7500 cm2 | INTRAMUSCULAR | 2 refills | Status: DC

## 2019-03-18 NOTE — Progress Notes (Signed)
Date:  03/18/2019   Name:  Seth Conley   DOB:  May 27, 1981   MRN:  [id number]   Chief Complaint: hypotestosterone  Patient is a 38 year old male who presents for a comprehensive physical exam. The patient reports the following problems: Difficulty with administration of intramuscular testosterone cypionate.Marland Kitchen Health maintenance has been reviewed up to date.  Patient has had increasing difficulty administering the 1 cc dosing of his testosterone cypionate at the half cc range.  Has resulted in loss in him volume of the medication and patient feels that this is not been a sufficient dosing since he is gone to 1 cc vials.  To counter this patient will continue to get the 200 mg/cc testosterone cypionate but will increase to three quarters of a cc for total dosing of 150 cc every 2 weeks.   Review of Systems  Constitutional: Negative for chills and fever.  HENT: Negative for drooling, ear discharge, ear pain and sore throat.   Respiratory: Negative for cough, shortness of breath and wheezing.   Cardiovascular: Negative for chest pain, palpitations and leg swelling.  Gastrointestinal: Negative for abdominal pain, blood in stool, constipation, diarrhea and nausea.  Endocrine: Negative for polydipsia.  Genitourinary: Negative for dysuria, frequency, hematuria and urgency.  Musculoskeletal: Negative for back pain, myalgias and neck pain.  Skin: Negative for rash.  Allergic/Immunologic: Negative for environmental allergies.  Neurological: Negative for dizziness and headaches.  Hematological: Does not bruise/bleed easily.  Psychiatric/Behavioral: Negative for suicidal ideas. The patient is not nervous/anxious.     Patient Active Problem List   Diagnosis Date Noted  . Hormone replacement therapy (HRT) 07/14/2018    Allergies  Allergen Reactions  . Amoxicillin Hives and Rash  . Hydrocodone Shortness Of Breath  . Sulfa Antibiotics Anaphylaxis  . Clindamycin Hives  . Penicillins Rash     Past Surgical History:  Procedure Laterality Date  . VAGINAL HYSTERECTOMY  2015    Social History   Tobacco Use  . Smoking status: Former Smoker    Last attempt to quit: 03/12/2019    Years since quitting: 0.0  . Smokeless tobacco: Never Used  . Tobacco comment: using lozenges  Substance Use Topics  . Alcohol use: Yes    Alcohol/week: 0.0 standard drinks  . Drug use: No     Medication list has been reviewed and updated.  Current Meds  Medication Sig  . aspirin EC 81 MG tablet Take 1 tablet (81 mg total) by mouth daily. Reported on 02/06/2016  . Omega-3 1000 MG CAPS Take 1 capsule (1,000 mg total) by mouth 2 (two) times daily. Reported on 02/06/2016  . testosterone cypionate (DEPOTESTOTERONE CYPIONATE) 100 MG/ML injection Inject 2 mLs (200 mg total) into the muscle every 14 (fourteen) days. Inject 1/2 cc every 14 days  . Turmeric, Curcuma Longa, POWD 1 scoop by Does not apply route daily.    PHQ 2/9 Scores 03/18/2019 07/14/2018 09/29/2017 02/06/2016  PHQ - 2 Score 0 0 4 0  PHQ- 9 Score 0 3 10 -    BP Readings from Last 3 Encounters:  03/18/19 120/70  07/14/18 130/80  09/29/17 120/70    Physical Exam Vitals signs and nursing note reviewed.  HENT:     Head: Normocephalic.     Right Ear: External ear normal.     Left Ear: External ear normal.     Nose: Nose normal.  Eyes:     General: No scleral icterus.       Right eye:  No discharge.        Left eye: No discharge.     Conjunctiva/sclera: Conjunctivae normal.     Pupils: Pupils are equal, round, and reactive to light.  Neck:     Musculoskeletal: Normal range of motion and neck supple.     Thyroid: No thyromegaly.     Vascular: No JVD.     Trachea: No tracheal deviation.  Cardiovascular:     Rate and Rhythm: Normal rate and regular rhythm.     Heart sounds: Normal heart sounds. No murmur. No friction rub. No gallop.   Pulmonary:     Effort: No respiratory distress.     Breath sounds: Normal breath sounds. No  wheezing or rales.  Abdominal:     General: Bowel sounds are normal.     Palpations: Abdomen is soft. There is no mass.     Tenderness: There is no abdominal tenderness. There is no guarding or rebound.  Musculoskeletal: Normal range of motion.        General: No tenderness.  Lymphadenopathy:     Cervical: No cervical adenopathy.  Skin:    General: Skin is warm.     Findings: No rash.  Neurological:     Mental Status: He is alert and oriented to person, place, and time.     Cranial Nerves: No cranial nerve deficit.     Deep Tendon Reflexes: Reflexes are normal and symmetric.     Wt Readings from Last 3 Encounters:  03/18/19 165 lb (74.8 kg)  07/14/18 161 lb (73 kg)  09/29/17 162 lb (73.5 kg)    BP 120/70   Pulse 72   Ht 5\' 2"  (1.575 m)   Wt 165 lb (74.8 kg)   BMI 30.18 kg/m   Assessment and Plan: 1. Hormone replacement therapy (HRT) Patient has had increasing difficulty administering the proper amount of tonsillar cypionate since going to 1 cc vials.  Is not giving him enough leeway for the possibility of losing a drop or more with the removal of air bubbles another manipulations.  Patient would like to increase dose to make sure that he is getting his proper amount.  Patient was increased to 0.75 cc every 14 days which is the equivalent of 150 mg of testosterone.  This is assuming the patient will continue to get testosterone cypionate 200 mg/cc patient was given written prescription and will anticipate discussing this with pharmacy.

## 2019-06-30 ENCOUNTER — Encounter: Payer: Self-pay | Admitting: Family Medicine

## 2019-06-30 ENCOUNTER — Ambulatory Visit: Payer: Self-pay | Admitting: Family Medicine

## 2019-06-30 ENCOUNTER — Other Ambulatory Visit: Payer: Self-pay

## 2019-06-30 ENCOUNTER — Ambulatory Visit (INDEPENDENT_AMBULATORY_CARE_PROVIDER_SITE_OTHER): Payer: Self-pay | Admitting: Family Medicine

## 2019-06-30 VITALS — BP 120/80 | HR 88 | Ht 62.0 in | Wt 170.0 lb

## 2019-06-30 DIAGNOSIS — G450 Vertebro-basilar artery syndrome: Secondary | ICD-10-CM

## 2019-06-30 DIAGNOSIS — R55 Syncope and collapse: Secondary | ICD-10-CM

## 2019-06-30 NOTE — Progress Notes (Signed)
Date:  06/30/2019   Name:  Seth Conley   DOB:  [birth year]   MRN:  [id number]   Chief Complaint: ref cardio (had a syncope spell that lasted approx 10-30 seconds. "takes a few minutes for me to feel normal again"- feels "intense pressure and pulsating in head"- neurosurg suggested for him to get holter monitor and wear it longer than before)  Loss of Consciousness This is a new (near syncope episodes) problem. The current episode started more than 1 month ago (past 6 months). The problem has been gradually worsening (frequency). He lost consciousness for a period of less than 1 minute. The symptoms are aggravated by turning head (extension of head/turning head to side). Associated symptoms include confusion, headaches and a visual change. Pertinent negatives include no abdominal pain, auditory change, aura, back pain, bladder incontinence, bowel incontinence, chest pain, clumsiness, diaphoresis, dizziness, fever, focal sensory loss, focal weakness, light-headedness, malaise/fatigue, nausea, palpitations, slurred speech, vertigo, vomiting or weakness. Associated symptoms comments: Horrible pressure in head/expressive aphasia.    Review of Systems  Constitutional: Negative for chills, diaphoresis, fever and malaise/fatigue.  HENT: Negative for drooling, ear discharge, ear pain and sore throat.   Respiratory: Negative for cough, shortness of breath and wheezing.   Cardiovascular: Positive for syncope. Negative for chest pain, palpitations and leg swelling.  Gastrointestinal: Negative for abdominal pain, blood in stool, bowel incontinence, constipation, diarrhea, nausea and vomiting.  Endocrine: Negative for polydipsia.  Genitourinary: Negative for bladder incontinence, dysuria, frequency, hematuria and urgency.  Musculoskeletal: Negative for back pain, myalgias and neck pain.  Skin: Negative for rash.  Allergic/Immunologic: Negative for environmental allergies.  Neurological: Positive  for headaches. Negative for dizziness, vertigo, focal weakness, weakness and light-headedness.  Hematological: Does not bruise/bleed easily.  Psychiatric/Behavioral: Positive for confusion. Negative for suicidal ideas. The patient is not nervous/anxious.     Patient Active Problem List   Diagnosis Date Noted  . Hormone replacement therapy (HRT) 07/14/2018  . Acute pain of left knee 01/23/2017  . 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 Amoxicillin Hives and Rash  . Hydrocodone Shortness Of Breath  . Sulfa Antibiotics Anaphylaxis  . Clindamycin Hives  . Penicillins Rash    Past Surgical History:  Procedure Laterality Date  . VAGINAL HYSTERECTOMY  2015    Social History   Tobacco Use  . Smoking status: Former Smoker    Quit date: 03/12/2019    Years since quitting: 0.3  . Smokeless tobacco: Never Used  . Tobacco comment: using lozenges  Substance Use Topics  . Alcohol use: Yes    Alcohol/week: 0.0 standard drinks  . Drug use: No     Medication list has been reviewed and updated.  Current Meds  Medication Sig  . aspirin EC 81 MG tablet Take 1 tablet (81 mg total) by mouth daily. Reported on 02/06/2016  . Omega-3 1000 MG CAPS Take 1 capsule (1,000 mg total) by mouth 2 (two) times daily. Reported on 02/06/2016 (Patient taking differently: Take 1 capsule by mouth daily. Reported on 02/06/2016)  . Testosterone Cypionate 200 MG/ML SOLN Inject 0.75 cm2 as directed every 14 (fourteen) days.    PHQ 2/9 Scores 06/30/2019 03/18/2019 07/14/2018 09/29/2017  PHQ - 2 Score 1 0 0 4  PHQ- 9 Score 3 0 3 10    BP Readings from Last 3 Encounters:  06/30/19 120/80  03/18/19 120/70  07/14/18 130/80  Physical Exam Vitals signs and nursing note reviewed.  HENT:     Head: Normocephalic.     Right Ear: Tympanic membrane, ear canal and external ear normal.     Left Ear: Tympanic membrane, ear canal  and external ear normal.     Nose: Nose normal. No congestion or rhinorrhea.     Mouth/Throat:     Mouth: Mucous membranes are moist.  Eyes:     General: No scleral icterus.       Right eye: No discharge.        Left eye: No discharge.     Conjunctiva/sclera: Conjunctivae normal.     Pupils: Pupils are equal, round, and reactive to light.  Neck:     Musculoskeletal: Normal range of motion and neck supple.     Thyroid: No thyromegaly.     Vascular: No JVD.     Trachea: No tracheal deviation.  Cardiovascular:     Rate and Rhythm: Normal rate and regular rhythm.     Heart sounds: Normal heart sounds. No murmur. No friction rub. No gallop.   Pulmonary:     Effort: No respiratory distress.     Breath sounds: Normal breath sounds. No wheezing, rhonchi or rales.  Abdominal:     General: Bowel sounds are normal.     Palpations: Abdomen is soft. There is no mass.     Tenderness: There is no abdominal tenderness. There is no guarding or rebound.  Musculoskeletal: Normal range of motion.        General: No tenderness.  Lymphadenopathy:     Cervical: No cervical adenopathy.  Skin:    General: Skin is warm.     Findings: No rash.  Neurological:     General: No focal deficit present.     Mental Status: He is alert and oriented to person, place, and time.     Cranial Nerves: Cranial nerves are intact. No cranial nerve deficit.     Sensory: Sensation is intact.     Motor: Motor function is intact.     Coordination: Coordination is intact.     Gait: Gait is intact.     Deep Tendon Reflexes: Reflexes are normal and symmetric.     Reflex Scores:      Tricep reflexes are 2+ on the right side and 2+ on the left side.      Bicep reflexes are 2+ on the right side and 2+ on the left side.      Brachioradialis reflexes are 2+ on the right side and 2+ on the left side.      Patellar reflexes are 2+ on the right side and 2+ on the left side.      Achilles reflexes are 2+ on the right side and 2+  on the left side.    Wt Readings from Last 3 Encounters:  06/30/19 170 lb (77.1 kg)  03/18/19 165 lb (74.8 kg)  07/14/18 161 lb (73 kg)    BP 120/80   Pulse 88   Ht 5\' 2"  (1.575 m)   Wt 170 lb (77.1 kg)   BMI 31.09 kg/m   Assessment and Plan: 1. Near syncope Patient says that he has been having episodes of almost passing out for years but most recently over the last 6 months this has increased in frequency and intensity this is been associated with expressive a aphasia almost loss of consciousness and just being "out of it ".  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  There is no palpitations and no chest pain and patient has had an 2016 a Holter monitor.  We will refer to neurology for evaluation of these recurrent nature patient does admit to having a sudden pressure in the head associated with these episodes whether it is an atypical migraine associated with vertebrobasilar insufficiency. - Ambulatory referral to Cardiology - Ambulatory referral to Neurology  2. Vertebral basilar insufficiency Patient was given information on vertebrobasilar insufficiency and this may be vascular in nature given that it depends to be associated with the position of his neck rather is hyperextended or turns a certain way while underneath the dashboard while working.  We will initiate with a neurology consult and progress if necessary to vascular and or further cardiac evaluation as needed.

## 2019-06-30 NOTE — Patient Instructions (Signed)
Vertebrobasilar Disease Vertebrobasilar disease is a condition in which the blood vessels in the back of the brain (vertebrobasilar arteries) become narrowed or blocked. This can slow or stop the flow of blood to the brain. This disease increases the risk of a warning stroke (transient ischemic attack, or TIA) or an actual stroke. Vertebrobasilar disease is also called vertebrobasilar insufficiency. What are the causes? This condition may be caused by:  A blockage in the vertebrobasilar arteries that results from a buildup of plaque (atherosclerosis).  A tear in a vertebrobasilar artery (dissection). The condition can be triggered by:  Dehydration.  Sudden changes in blood pressure.  Sudden changes in position.  An injury.  Turning your head to an extreme position.  Overextending your neck. What increases the risk? This condition is more likely to develop in people who:  Are over the age of 38.  Are male.  Are smokers.  Have high blood pressure, high cholesterol, obesity, or diabetes.  Have a family history of this condition.  Have an inactive lifestyle. What are the signs or symptoms? Symptoms of this condition include:  Numbness or weakness in the arms, legs, or face.  Sudden, severe dizziness (vertigo).  Suddenly falling down.  Loss of coordination.  Vision problems, such as double vision or vision loss.  Nausea and vomiting.  Slurred speech.  Trouble swallowing.  Headaches that affect the back of the head.  Confusion. How is this diagnosed? This condition is diagnosed with a physical exam and tests. Tests may include:  An ultrasound (transcranial Doppler).  A CT scan or an MRI of the blood vessels in the back of your brain (CT angiogram or magnetic resonance angiogram).  Digital subtraction angiogram. In this test, a thin tube (catheter) is inserted into an artery in your thigh and moved into the arteries in your neck and brain. Dye is released from  the catheter, and X-rays are taken to follow the path of the blood flow. How is this treated? Treatment for this condition depends on the cause. Treatment may include:  Medicines that thin the blood.  Medicines to manage conditions like high blood pressure, cholesterol, or diabetes. Treatment may also involve lifestyle changes, such as:  Exercising.  Eating healthier.  Managing your blood pressure, cholesterol, or diabetes.  Quitting smoking. Follow these instructions at home: Medicines  Take over-the-counter and prescription medicines only as told by your health care provider.  If you were told to take a medicine to thin your blood, such as aspirin or an anticoagulant, take it exactly as told by your health care provider. ? Taking too much blood-thinning medicine can cause bleeding. ? If you do not take enough blood-thinning medicine, you will not have the protection that you need against a stroke and other problems. General instructions  Do not use any products that contain nicotine or tobacco, such as cigarettes, e-cigarettes, and chewing tobacco. If you need help quitting, ask your health care provider.  Do not drink alcohol.  Follow an exercise program as told by your health care provider.  Maintain a healthy weight. Lose weight if your health care provider says that you need to do that.  Follow instructions from your health care provider about diet. You may need to: ? Eat more fruits, vegetables, and whole grains. ? Limit the amount of fat, salt, and sugar in your diet.  Keep all follow-up visits as told by your health care provider. This is important. Contact a health care provider if:  You have new  symptoms.  Your symptoms return. Get help right away if:  You have any symptoms of a stroke. "BE FAST" is an easy way to remember the main warning signs of a stroke: ? B - Balance. Signs are dizziness, sudden trouble walking, or loss of balance. ? E - Eyes. Signs are  trouble seeing or a sudden change in vision. ? F - Face. Signs are sudden weakness or numbness of the face, or the face or eyelid drooping on one side. ? A - Arms. Signs are weakness or numbness in an arm. This happens suddenly and usually on one side of the body. ? S - Speech. Signs are sudden trouble speaking, slurred speech, or trouble understanding what people say. ? T - Time. Time to call emergency services. Write down what time symptoms started.  You have other signs of a stroke, such as: ? A sudden, severe headache with no known cause. ? Nausea or vomiting. ? Seizure. These symptoms may represent a serious problem that is an emergency. Do not wait to see if the symptoms will go away. Get medical help right away. Call your local emergency services (911 in the [place] Do not drive yourself to the hospital. Summary  Vertebrobasilar disease involves a narrowing or blockage of the blood vessels in the back of the brain (vertebrobasilar arteries) that slows or stops blood flow to the area.  Vertebrobasilar disease can be caused by plaque building up in the arteries or a tear in the artery.  Vertebrobasilar disease can lead to stroke symptoms such as numbness or weakness in the arms, legs, or face, dizziness, double vision, or vision loss.  Treatment may include blood-thinning medicines or medicines to lower blood pressure, cholesterol, or blood sugar. This information is not intended to replace advice given to you by your health care provider. Make sure you discuss any questions you have with your health care provider. Document Released: 12/26/2004 Document Revised: 11/11/2018 Document Reviewed: 11/11/2018 Elsevier Patient Education  2020 [employer].

## 2019-07-15 ENCOUNTER — Encounter: Payer: Self-pay | Admitting: Family Medicine

## 2019-07-20 ENCOUNTER — Encounter: Payer: Self-pay | Admitting: Family Medicine

## 2019-07-20 ENCOUNTER — Ambulatory Visit (INDEPENDENT_AMBULATORY_CARE_PROVIDER_SITE_OTHER): Payer: Self-pay | Admitting: Family Medicine

## 2019-07-20 ENCOUNTER — Other Ambulatory Visit: Payer: Self-pay

## 2019-07-20 VITALS — BP 120/70 | HR 64 | Ht 62.0 in | Wt 167.0 lb

## 2019-07-20 DIAGNOSIS — S39011A Strain of muscle, fascia and tendon of abdomen, initial encounter: Secondary | ICD-10-CM

## 2019-07-20 DIAGNOSIS — K429 Umbilical hernia without obstruction or gangrene: Secondary | ICD-10-CM

## 2019-07-20 MED ORDER — CYCLOBENZAPRINE HCL 5 MG PO TABS: 5.0000 mg | ORAL_TABLET | Freq: Three times a day (TID) | ORAL | 1 refills | Status: DC | PRN

## 2019-07-20 NOTE — Patient Instructions (Addendum)
Diastasis Recti  Diastasis recti is when the muscles of the abdomen (rectus abdominis muscles) become thin and separate. The result is a wider space between the right and left abdomen (abdominal) muscles. This wider space between the muscles may cause a bulge in the middle of your abdomen. You may notice this bulge when you are straining or when you sit up from a lying down position. Diastasis recti can affect men and women. It is most common among pregnant women, infants, people who are obese, and people who have had abdominal surgery. Exercise or surgical treatment may help correct it. What are the causes? Common causes of this condition include:  Pregnancy. The growing uterus puts pressure on the abdominal muscles, which causes the muscles to separate.  Obesity. Excess fat puts pressure on abdominal muscles.  Weightlifting.  Some abdomen exercises.  Advanced age.  Genetics.  Prior abdominal surgery. What increases the risk? This condition is more likely to develop in:  Women.  Newborns, especially newborns who are born early (prematurely). What are the signs or symptoms? Common symptoms of this condition include:  A bulge in the middle of the abdomen. You will notice it most when you sit up or strain.  Pain in the low back, pelvis, or hips.  Constipation.  Inability to control when you urinate (urinary incontinence).  Bloating.  Poor posture. How is this diagnosed? This condition is diagnosed with a physical exam. Your health care provider will ask you to lie flat on your back and do a crunch or half sit-up. If you have diastasis recti, a vertical bulge will appear between your abdominal muscles in the center of your abdomen. Your health care provider will measure the gap between your muscles with one of the following:  A medical device used to measure the space between two objects (caliper).  A tape measure.  CT scan.  Ultrasound.  Finger spaces. Your health  care provider will measure the space using their fingers. How is this treated? If your muscle separation is not too large, you may not need treatment. However, if you are a woman who plans to become pregnant again, you should treat this condition before your next pregnancy. Treatment may include:  Physical therapy to strengthen and tighten your abdominal muscles.  Lifestyle changes such as weight loss and exercise.  Over-the-counter pain medicines as needed.  Surgery to correct the separation. Follow these instructions at home: Activity  Return to your normal activities as told by your health care provider. Ask your health care provider what activities are safe for you.  When lifting weights or doing exercises using your abdominal muscles or the muscles in the center of your body that give stability (core muscles), make sure you are doing your exercises and movements correctly. Proper form can help to prevent the condition from happening again. General instructions  If you are overweight, ask your health care provider for help with weight loss. Losing even a small amount of weight can help to improve your diastasis recti.  Take over-the-counter or prescription medicines only as told by your health care provider.  Do not strain. Straining can make the separation worse. Examples of straining include: ? Pushing hard to have a bowel movement, such as due to constipation. ? Lifting heavy objects, including children. ? Standing up and sitting down.  Take steps to prevent constipation:  Herniated Disk A herniated disk is when a disk in your spine bulges out too far. There is a disk with a spongy  center in between each pair of bones in the spine (vertebrae). These disks act as shock absorbers when you move. A herniated disk can cause pain and muscle weakness. This can happen anywhere in the back or neck. It happens most often in the lower back. What are the causes? This condition may be  caused by: Wear and tear as you age. Sudden injury, such as a strain or sprain. What increases the risk? The following factors may make you more likely to develop this condition: Aging. This is the main thing that increases your risk. Being a man who is 6530-38 years old. Frequently doing activities that involve heavy lifting, bending, or twisting. Frequently driving for long hours at a time. Not getting enough exercise. Being overweight. Using tobacco products. Other things that increase risk include: Having a family history of back problems or herniated disks. Being pregnant or giving birth. Not getting enough vitamins and minerals (having poor nutrition). Being tall. What are the signs or symptoms? Symptoms may vary depending on where your herniated disk is in your body. A herniated disk in the lower back may cause sharp pain in: Part of the arm, leg, hip, or butt. The back of the lower leg (calf). The lower back, spreading down through the leg into the foot (sciatica). A herniated disk in the neck may cause dizziness and a feeling that things around you are moving when they are not (vertigo). It may also cause pain or weakness in: The neck. The shoulder blades. The upper arm, lower arm, or fingers. You may also have muscle weakness. It may be hard to: Lift your leg or arm. Stand on your toes. Squeeze tightly with one of your hands. Other symptoms may include: Loss of feeling (numbness) or tingling in the affected areas of the hands, arms, feet, or legs. Being unable to control when you pee, or urinate, or when you poop, or have bowel movements. This is rare but serious. You may have a very bad herniated disk in the lower back. How is this diagnosed? This condition may be diagnosed by: Your symptoms. Your medical history. A physical exam. The exam may include: A straight-leg test. For this test, you will lie on your back while your doctor lifts your leg, keeping your knee  straight. If you feel pain, you likely have a herniated disk. Tests of your brain and nervous system. These include checking for numbness, reflexes, muscle strength, and problems with posture. Tests that create pictures (images), such as: X-rays. MRI. CT scan. Electromyogram (EMG). This test may be used to find out which nerves are affected by your herniated disk. How is this treated? This condition may be treated with: A short period of rest. This is usually the first treatment. You may be on bed rest for up to 2 days, or you may be told to stay home and avoid physical activity. If you have a herniated disk in your lower back, avoid sitting as much as possible. Sitting increases pressure on the disk. Medicines. These may include: NSAIDs, such as ibuprofen, to help reduce pain and swelling. Muscle relaxants to prevent sudden tightening of the back muscles (back spasms). Prescription pain medicines, if you have very bad pain. Ice or heat therapy. Steroid shots (injections) in the area of the herniated disk. These can help reduce pain and swelling. Physical therapy to strengthen your back muscles. In many cases, symptoms go away with treatment over a period of days or weeks. You will most likely be free  of symptoms after 3-4 months. If other treatments do not help, you may need surgery. Follow these instructions at home: Medicines Take over-the-counter and prescription medicines only as told by your doctor. Ask your doctor if the medicine prescribed to you: Requires you to avoid driving or using heavy machinery. Can cause trouble pooping (constipation). You may need to take these actions to prevent or treat trouble pooping: Drink enough fluid to keep your pee (urine) pale yellow. Take over-the-counter or prescription medicines. Eat foods that are high in fiber. These include beans, whole grains, and fresh fruits and vegetables. Limit foods that are high in fat and processed sugars. These  include fried or sweet foods. Activity Rest as told by your doctor. After your rest period: Return to your normal activities as told by your doctor. Ask your doctor what activities are safe for you. Use good posture. Avoid movements that cause pain. Do not lift anything that is heavier than 10 lb (4.5 kg), or the limit that you are told, until your doctor says that it is safe. Do not sit or stand for a long time without moving. Do not sit for a long time without getting up and moving around. Do exercises (physical therapy) as told. Try to strengthen your back and belly (abdomen) with exercises like crunches, swimming, or walking. General instructions Do not use any products that contain nicotine or tobacco, such as cigarettes, e-cigarettes, and chewing tobacco. If you need help quitting, ask your doctor. Do not wear high-heeled shoes. Do not sleep on your belly. If you are overweight, work with your doctor to lose weight safely. Keep all follow-up visits as told by your doctor. This is important. How is this prevented?  Stay at a healthy weight. Stay in shape. Do at least 150 minutes of moderate-intensity exercise each week, such as fast walking or water aerobics. When lifting objects: Keep your feet as far apart as your shoulders or farther apart. Tighten your belly muscles. Bend your knees and hips and keep your spine neutral. Lift using the strength of your legs, not your back. Do not lock your knees straight out. Always ask for help to lift heavy or awkward objects. Contact a doctor if: You have back pain or neck pain that does not get better after 6 weeks. You have very bad pain. You get any of these problems in any part of your body: Tingling. Weakness. Numbness. Get help right away if: You cannot move your arms or legs. You cannot control when you pee (urinate) or poop (have a bowel movement). You feel dizzy. You faint. You have trouble breathing. Summary A herniated  disk is when a disk in your spine bulges out too far. This condition may be caused by wear and tear as you age or sudden injury. Symptoms may vary depending on where your herniated disk is in your body. Treatment includes rest, medicines, ice or heat therapy, steroid shots, and exercises. You may need surgery if other treatments do not help. This information is not intended to replace advice given to you by your health care provider. Make sure you discuss any questions you have with your health care provider. Document Released: 04/04/2014 Document Revised: 03/11/2019 Document Reviewed: 05/16/2016 Elsevier Patient Education  2020 [employer]. ? Drink enough fluid to keep your urine clear or pale yellow. ? Take over-the-counter or prescription medicines only as directed. ? Eat foods that are high in fiber, such as fresh fruits and vegetables, whole grains, and beans. ? Limit  foods that are high in fat and processed sugars, such as fried and sweet foods. Contact a health care provider if:  You notice a new bulge in your abdomen. Get help right away if:  You experience severe discomfort in your abdomen.  You develop severe abdominal pain along with nausea, vomiting, or fever. Summary  Diastasis recti is when the abdomen (abdominal) muscles become thin and separate. Your abdomen will stick out because the space between your right and left abdomen muscles has widened.  The most common symptom is a bulge in your abdomen. You will notice it most when you sit up or are straining.  This condition is diagnosed during a physical exam.  If the abdomen separation is not too big, you may choose not to have treatment. Otherwise, you may need to undergo physical therapy or surgery. This information is not intended to replace advice given to you by your health care provider. Make sure you discuss any questions you have with your health care provider. Document Released: 01/13/2017 Document Revised:  10/31/2017 Document Reviewed: 01/13/2017 Elsevier Patient Education  2020 [employer]

## 2019-07-20 NOTE — Progress Notes (Signed)
Date:  07/20/2019   Name:  Seth Conley   DOB:  15-Oct-1981   MRN:  [id number]   Chief Complaint: Hernia (pushing vehicles to keep them from rolling back- pain in RLQ to umbilical. Had hysterectomy/ concerned about scar tissue)  Abdominal Pain This is a new problem. The current episode started in the past 7 days (Friday). The onset quality is sudden. The problem occurs constantly. The problem has been waxing and waning. The pain is located in the RLQ. Pain scale: 2-8 depending on activity. The pain is moderate. The quality of the pain is aching and sharp. The abdominal pain radiates to the right flank. Pertinent negatives include no anorexia, arthralgias, belching, constipation, diarrhea, dysuria, fever, flatus, frequency, headaches, hematochezia, hematuria, melena, myalgias, nausea, vomiting or weight loss. The pain is aggravated by certain positions (squatting). The pain is relieved by certain positions. He has tried nothing for the symptoms. The treatment provided moderate relief. pelvic surgery    Review of Systems  Constitutional: Negative for chills, fever and weight loss.  HENT: Negative for drooling, ear discharge, ear pain and sore throat.   Respiratory: Negative for cough, shortness of breath and wheezing.   Cardiovascular: Negative for chest pain, palpitations and leg swelling.  Gastrointestinal: Positive for abdominal pain. Negative for anorexia, blood in stool, constipation, diarrhea, flatus, hematochezia, melena, nausea and vomiting.  Endocrine: Negative for polydipsia.  Genitourinary: Negative for dysuria, frequency, hematuria and urgency.  Musculoskeletal: Negative for arthralgias, back pain, myalgias and neck pain.  Skin: Negative for rash.  Allergic/Immunologic: Negative for environmental allergies.  Neurological: Negative for dizziness and headaches.  Hematological: Does not bruise/bleed easily.  Psychiatric/Behavioral: Negative for suicidal ideas. The patient is  not nervous/anxious.     Patient Active Problem List   Diagnosis Date Noted  . Hormone replacement therapy (HRT) 07/14/2018  . Acute pain of left knee 01/23/2017  . 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 Amoxicillin Hives and Rash  . Hydrocodone Shortness Of Breath  . Sulfa Antibiotics Anaphylaxis  . Clindamycin Hives  . Penicillins Rash    Past Surgical History:  Procedure Laterality Date  . VAGINAL HYSTERECTOMY  2015    Social History   Tobacco Use  . Smoking status: Former Smoker    Quit date: 03/12/2019    Years since quitting: 0.3  . Smokeless tobacco: Never Used  . Tobacco comment: using lozenges  Substance Use Topics  . Alcohol use: Yes    Alcohol/week: 0.0 standard drinks  . Drug use: No     Medication list has been reviewed and updated.  Current Meds  Medication Sig  . aspirin EC 81 MG tablet Take 1 tablet (81 mg total) by mouth daily. Reported on 02/06/2016  . meloxicam (MOBIC) 15 MG tablet Take by mouth.  . Omega-3 1000 MG CAPS Take 1 capsule (1,000 mg total) by mouth 2 (two) times daily. Reported on 02/06/2016 (Patient taking differently: Take 1 capsule by mouth daily. Reported on 02/06/2016)  . Testosterone Cypionate 200 MG/ML SOLN Inject 0.75 cm2 as directed every 14 (fourteen) days.  . Turmeric, Curcuma Longa, POWD 1 scoop by Does not apply route daily.    PHQ 2/9 Scores 06/30/2019 03/18/2019 07/14/2018 09/29/2017  PHQ - 2 Score 1 0 0 4  PHQ- 9 Score 3 0 3 10    BP Readings from Last 3 Encounters:  07/20/19 120/70  06/30/19 120/80  03/18/19  120/70    Physical Exam Vitals signs and nursing note reviewed.  HENT:     Head: Normocephalic.     Right Ear: Tympanic membrane, ear canal and external ear normal.     Left Ear: Tympanic membrane, ear canal and external ear normal.     Nose: Nose normal.     Mouth/Throat:     Mouth: Mucous membranes are moist.   Eyes:     General: No scleral icterus.       Right eye: No discharge.        Left eye: No discharge.     Conjunctiva/sclera: Conjunctivae normal.     Pupils: Pupils are equal, round, and reactive to light.  Neck:     Musculoskeletal: Normal range of motion and neck supple.     Thyroid: No thyromegaly.     Vascular: No JVD.     Trachea: No tracheal deviation.  Cardiovascular:     Rate and Rhythm: Normal rate and regular rhythm.     Pulses: Normal pulses.     Heart sounds: Normal heart sounds. No murmur. No friction rub. No gallop.   Pulmonary:     Effort: No respiratory distress.     Breath sounds: Normal breath sounds. No wheezing, rhonchi or rales.  Abdominal:     General: Bowel sounds are normal.     Palpations: Abdomen is soft. There is no hepatomegaly, splenomegaly or mass.     Tenderness: There is abdominal tenderness in the right lower quadrant and periumbilical area. There is no right CVA tenderness, left CVA tenderness, guarding or rebound.     Hernia: A hernia is present. Hernia is present in the umbilical area.     Comments: Positive carnett  Musculoskeletal: Normal range of motion.        General: No tenderness.  Lymphadenopathy:     Cervical: No cervical adenopathy.  Skin:    General: Skin is warm.     Findings: No rash.  Neurological:     General: No focal deficit present.     Mental Status: He is alert and oriented to person, place, and time.     Cranial Nerves: No cranial nerve deficit.     Deep Tendon Reflexes: Reflexes are normal and symmetric.     Wt Readings from Last 3 Encounters:  07/20/19 167 lb (75.8 kg)  06/30/19 170 lb (77.1 kg)  03/18/19 165 lb (74.8 kg)    BP 120/70   Pulse 64   Ht 5\' 2"  (1.575 m)   Wt 167 lb (75.8 kg)   BMI 30.54 kg/m   Assessment and Plan: 1. Strain of rectus abdominis muscle, initial encounter Patient was pushing a 9000 pound car when he felt a tear in the right lower quadrant in an area where he had had  laparoscopic surgery.  There is tenderness noted in this area as well as around the umbilicus.  Patient had a positive Carnett sign.  Information was given.  There was no guarding or rebound which would make appendicitis unlikely.  In addition to the meloxicam that he is taking presently we gave him some cyclobenzaprine to take and information on watching if he continues to have pain or if it worsens patient is to call back for further instructions. - cyclobenzaprine (FLEXERIL) 5 MG tablet; Take 1 tablet (5 mg total) by mouth 3 (three) times daily as needed for muscle spasms.  Dispense: 45 tablet; Refill: 1  2. Umbilical hernia without obstruction and without gangrene There  is tenderness in the umbilicus.  This is consistent with may be of mild opening of the umbilicus where they did laparoscopic surgery.  There may be a bit of an incisional hernia at the other site that the patient is having discomfort today may be somewhat of an incisional hernia but nothing that is suggesting incarceration or gangrene.

## 2019-09-13 ENCOUNTER — Other Ambulatory Visit: Payer: Self-pay

## 2019-09-13 DIAGNOSIS — R42 Dizziness and giddiness: Secondary | ICD-10-CM

## 2019-09-13 MED ORDER — MECLIZINE HCL 12.5 MG PO TABS: 12.5000 mg | ORAL_TABLET | Freq: Three times a day (TID) | ORAL | 0 refills | Status: DC | PRN

## 2019-09-13 NOTE — Progress Notes (Unsigned)
Sent in meclizine to [facility]

## 2019-09-29 ENCOUNTER — Ambulatory Visit: Payer: Self-pay | Admitting: Family Medicine

## 2019-10-01 ENCOUNTER — Ambulatory Visit (INDEPENDENT_AMBULATORY_CARE_PROVIDER_SITE_OTHER): Payer: Self-pay | Admitting: Family Medicine

## 2019-10-01 ENCOUNTER — Encounter: Payer: Self-pay | Admitting: Family Medicine

## 2019-10-01 ENCOUNTER — Other Ambulatory Visit: Payer: Self-pay

## 2019-10-01 VITALS — BP 122/82 | HR 78 | Resp 16 | Ht 62.0 in | Wt 168.0 lb

## 2019-10-01 DIAGNOSIS — Z79890 Hormone replacement therapy (postmenopausal): Secondary | ICD-10-CM

## 2019-10-01 DIAGNOSIS — Z23 Encounter for immunization: Secondary | ICD-10-CM

## 2019-10-01 MED ORDER — TESTOSTERONE CYPIONATE 200 MG/ML IJ SOLN: 0.7500 cm2 | INTRAMUSCULAR | 2 refills | Status: DC

## 2019-10-01 NOTE — Progress Notes (Signed)
Date:  10/01/2019   Name:  Seth Conley   DOB:  [birth year]   MRN:  [id number]   Chief Complaint: Hormone Replacement Therapy  Patient is a 38 year old male who presents for a HRT exam. The patient reports the following problems: none. Health maintenance has been reviewed up to date.   Review of Systems  Constitutional: Negative for chills and fever.  HENT: Negative for drooling, ear discharge, ear pain and sore throat.   Respiratory: Negative for cough, shortness of breath and wheezing.   Cardiovascular: Negative for chest pain, palpitations and leg swelling.  Gastrointestinal: Negative for abdominal pain, blood in stool, constipation, diarrhea and nausea.  Endocrine: Negative for polydipsia.  Genitourinary: Negative for dysuria, frequency, hematuria and urgency.  Musculoskeletal: Negative for back pain, myalgias and neck pain.  Skin: Negative for rash.  Allergic/Immunologic: Negative for environmental allergies.  Neurological: Negative for dizziness and headaches.  Hematological: Does not bruise/bleed easily.  Psychiatric/Behavioral: Negative for suicidal ideas. The patient is not nervous/anxious.   All other systems reviewed and are negative.   Patient Active Problem List   Diagnosis Date Noted  . Hormone replacement therapy (HRT) 07/14/2018  . Acute pain of left knee 01/23/2017  . 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 Amoxicillin Hives and Rash  . Hydrocodone Shortness Of Breath  . Sulfa Antibiotics Anaphylaxis  . Clindamycin Hives  . Penicillins Rash    Past Surgical History:  Procedure Laterality Date  . VAGINAL HYSTERECTOMY  2015    Social History   Tobacco Use  . Smoking status: Former Smoker    Quit date: 03/12/2019    Years since quitting: 0.5  . Smokeless tobacco: Never Used  . Tobacco comment: using lozenges  Substance Use Topics  . Alcohol use:  Yes    Alcohol/week: 0.0 standard drinks  . Drug use: No     Medication list has been reviewed and updated.  Current Meds  Medication Sig  . aspirin EC 81 MG tablet Take 1 tablet (81 mg total) by mouth daily. Reported on 02/06/2016  . cyclobenzaprine (FLEXERIL) 5 MG tablet Take 1 tablet (5 mg total) by mouth 3 (three) times daily as needed for muscle spasms.  . meclizine (ANTIVERT) 12.5 MG tablet Take 1 tablet (12.5 mg total) by mouth 3 (three) times daily as needed for dizziness.  . Testosterone Cypionate 200 MG/ML SOLN Inject 0.75 cm2 as directed every 14 (fourteen) days.  . traMADol (ULTRAM) 50 MG tablet Take 50 mg by mouth every 6 (six) hours as needed. As needed-[facility]  . Turmeric, Curcuma Longa, POWD 1 scoop by Does not apply route daily.    PHQ 2/9 Scores 10/01/2019 06/30/2019 03/18/2019 07/14/2018  PHQ - 2 Score 0 1 0 0  PHQ- 9 Score 0 3 0 3    BP Readings from Last 3 Encounters:  10/01/19 122/82  07/20/19 120/70  06/30/19 120/80    Physical Exam Vitals signs and nursing note reviewed.  HENT:     Head: Normocephalic.     Right Ear: Tympanic membrane, ear canal and external ear normal.     Left Ear: Tympanic membrane, ear canal and external ear normal.     Nose: Nose normal.  Eyes:     General: No scleral icterus.       Right eye: No discharge.        Left eye: No discharge.  Conjunctiva/sclera: Conjunctivae normal.     Pupils: Pupils are equal, round, and reactive to light.  Neck:     Musculoskeletal: Normal range of motion and neck supple.     Thyroid: No thyromegaly.     Vascular: No JVD.     Trachea: No tracheal deviation.  Cardiovascular:     Rate and Rhythm: Normal rate and regular rhythm.     Heart sounds: Normal heart sounds. No murmur. No friction rub. No gallop.   Pulmonary:     Effort: No respiratory distress.     Breath sounds: Normal breath sounds. No wheezing or rales.  Abdominal:     General: Bowel sounds are normal.     Palpations:  Abdomen is soft. There is no mass.     Tenderness: There is no abdominal tenderness. There is no guarding or rebound.  Musculoskeletal: Normal range of motion.        General: No tenderness.  Lymphadenopathy:     Cervical: No cervical adenopathy.  Skin:    General: Skin is warm.     Findings: No rash.  Neurological:     Mental Status: He is alert and oriented to person, place, and time.     Cranial Nerves: No cranial nerve deficit.     Deep Tendon Reflexes: Reflexes are normal and symmetric.     Wt Readings from Last 3 Encounters:  10/01/19 168 lb (76.2 kg)  07/20/19 167 lb (75.8 kg)  06/30/19 170 lb (77.1 kg)    BP 122/82   Pulse 78   Resp 16   Ht 5\' 2"  (1.575 m)   Wt 168 lb (76.2 kg)   SpO2 98%   BMI 30.73 kg/m   Assessment and Plan:  1. Hormone replacement therapy (HRT) Chronic.  Controlled.  Stable.  Patient is tolerating this regimen of testosterone cypionate three quarters of a cc of 150 mg every 2 weeks.  We will check renal function panel and hepatic function panel for toxicity. - Hepatic function panel - Renal Function Panel - Testosterone Cypionate 200 MG/ML SOLN; Inject 0.75 cm2 as directed every 14 (fourteen) days.  Dispense: 10 mL; Refill: 2  2. Influenza vaccine needed Discussed and administered - Flu Vaccine QUAD 6+ mos PF IM (Fluarix Quad PF)

## 2019-10-08 ENCOUNTER — Telehealth: Payer: Self-pay

## 2019-10-08 NOTE — Telephone Encounter (Signed)
I spoke to pt today due to a letter we had received from a [place], requesting labs on pt. I asked pt about the labs ordered at last visit, he stated he never got them "because of money." I explained to pt the importance of getting these labs in order for [place] to continue prescribing his meds, especially since we have not had any since 2018. He said he would go to a [facility] closer to him. I asked that he make sure we get the lab results to document in the chart.  Pt voiced understanding.

## 2019-12-09 ENCOUNTER — Other Ambulatory Visit: Payer: Self-pay

## 2019-12-09 ENCOUNTER — Telehealth: Payer: Self-pay

## 2019-12-09 MED ORDER — DOXYCYCLINE HYCLATE 100 MG PO TABS: 100.0000 mg | ORAL_TABLET | Freq: Two times a day (BID) | ORAL | 0 refills | Status: DC

## 2019-12-09 NOTE — Telephone Encounter (Signed)
Pt called in stating he has a cyst on chest/ scar from breast reduction that has burst. We sent in antibiotic and told him to call the surgeon nearest him to see if they can open and drain the cyst further to keep from it recurring. Pt voiced understanding

## 2019-12-09 NOTE — Progress Notes (Signed)
Put in Rx for doxy sent to [facility]

## 2020-04-04 ENCOUNTER — Telehealth: Payer: Self-pay | Admitting: Family Medicine

## 2020-04-04 ENCOUNTER — Ambulatory Visit: Payer: Self-pay | Admitting: Family Medicine

## 2020-04-04 NOTE — Telephone Encounter (Signed)
Copied from CRM [id number]. Topic: General - Other >> Apr 04, 2020 12:40 PM Tamela Oddi wrote: Reason for CRM: Patient is returning a call to [place].  Please call patient back at [phone]

## 2020-04-04 NOTE — Telephone Encounter (Signed)
Did you try to call him concerning his no show today? I didn't call him

## 2020-04-12 ENCOUNTER — Ambulatory Visit: Payer: Self-pay | Admitting: Family Medicine

## 2020-04-19 ENCOUNTER — Other Ambulatory Visit
Admission: RE | Admit: 2020-04-19 | Discharge: 2020-04-19 | Disposition: A | Discharge status: Home / Self Care | Payer: PRIVATE HEALTH INSURANCE | Attending: Family Medicine | Admitting: Family Medicine

## 2020-04-19 ENCOUNTER — Encounter: Payer: Self-pay | Admitting: Family Medicine

## 2020-04-19 ENCOUNTER — Other Ambulatory Visit: Payer: Self-pay

## 2020-04-19 ENCOUNTER — Ambulatory Visit: Payer: PRIVATE HEALTH INSURANCE | Admitting: Family Medicine

## 2020-04-19 DIAGNOSIS — Z79890 Hormone replacement therapy (postmenopausal): Secondary | ICD-10-CM | POA: Diagnosis not present

## 2020-04-19 DIAGNOSIS — Z79899 Other long term (current) drug therapy: Secondary | ICD-10-CM | POA: Insufficient documentation

## 2020-04-19 DIAGNOSIS — E785 Hyperlipidemia, unspecified: Secondary | ICD-10-CM | POA: Diagnosis present

## 2020-04-19 LAB — COMPREHENSIVE METABOLIC PANEL
ALT: 20 U/L (ref 0–44)
AST: 20 U/L (ref 15–41)
Albumin: 4.3 g/dL (ref 3.5–5.0)
Alkaline Phosphatase: 43 U/L (ref 38–126)
Anion gap: 7 (ref 5–15)
BUN: 18 mg/dL (ref 6–20)
CO2: 27 mmol/L (ref 22–32)
Calcium: 9 mg/dL (ref 8.9–10.3)
Chloride: 103 mmol/L (ref 98–111)
Creatinine, Ser: 1.09 mg/dL (ref 0.61–1.24)
GFR calc Af Amer: >60 mL/min (ref >60)
GFR calc non Af Amer: >60 mL/min (ref >60)
Glucose, Bld: 103 mg/dL — ABNORMAL HIGH (ref 70–99)
Potassium: 4.8 mmol/L (ref 3.5–5.1)
Sodium: 137 mmol/L (ref 135–145)
Total Bilirubin: 0.3 mg/dL (ref 0.3–1.2)
Total Protein: 7.3 g/dL (ref 6.5–8.1)

## 2020-04-19 LAB — LIPID PANEL
Cholesterol: 186 mg/dL (ref 0–200)
HDL: 35 mg/dL — ABNORMAL LOW (ref >40)
LDL Cholesterol: 96 mg/dL (ref 0–99)
Total CHOL/HDL Ratio: 5.3 RATIO
Triglycerides: 276 mg/dL — ABNORMAL HIGH (ref <150)
VLDL: 55 mg/dL — ABNORMAL HIGH (ref 0–40)

## 2020-04-19 MED ORDER — TESTOSTERONE CYPIONATE 200 MG/ML IJ SOLN: 0.7500 cm2 | INTRAMUSCULAR | 2 refills | Status: DC

## 2020-04-19 NOTE — Progress Notes (Signed)
Date:  04/19/2020   Name:  Seth Conley   DOB:  10/29/1981   MRN:  [id number]   Chief Complaint: hormone replacement  Patient is a 39 year old male who presents for a gender affirmation hormone therapy exam. The patient reports the following problems: none. Health maintenance has been reviewed up to date.   Lab Results  Component Value Date   CREATININE 0.94 09/29/2017   BUN 14 09/29/2017   NA 141 09/29/2017   K 4.3 09/29/2017   CL 101 09/29/2017   CO2 24 09/29/2017   No results found for: CHOL, HDL, LDLCALC, LDLDIRECT, TRIG, CHOLHDL No results found for: TSH No results found for: HGBA1C No results found for: WBC, HGB, HCT, MCV, PLT Lab Results  Component Value Date   ALT 23 09/29/2017   AST 16 09/29/2017   ALKPHOS 47 09/29/2017   BILITOT 0.2 09/29/2017     Review of Systems  Constitutional: Negative for chills and fever.  HENT: Negative for drooling, ear discharge, ear pain and sore throat.   Respiratory: Negative for cough, shortness of breath and wheezing.   Cardiovascular: Negative for chest pain, palpitations and leg swelling.  Gastrointestinal: Negative for abdominal pain, blood in stool, constipation, diarrhea and nausea.  Endocrine: Negative for polydipsia.  Genitourinary: Negative for dysuria, frequency, hematuria and urgency.  Musculoskeletal: Negative for back pain, myalgias and neck pain.  Skin: Negative for rash.  Allergic/Immunologic: Negative for environmental allergies.  Neurological: Negative for dizziness and headaches.  Hematological: Does not bruise/bleed easily.  Psychiatric/Behavioral: Negative for suicidal ideas. The patient is not nervous/anxious.     Patient Active Problem List   Diagnosis Date Noted  . Hormone replacement therapy (HRT) 07/14/2018  . Acute pain of left knee 01/23/2017  . 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  Amoxicillin Hives and Rash  . Hydrocodone Shortness Of Breath  . Sulfa Antibiotics Anaphylaxis  . Clindamycin Hives  . Penicillins Rash    Past Surgical History:  Procedure Laterality Date  . VAGINAL HYSTERECTOMY  2015    Social History   Tobacco Use  . Smoking status: Former Smoker    Quit date: 03/12/2019    Years since quitting: 1.1  . Smokeless tobacco: Never Used  . Tobacco comment: using lozenges  Substance Use Topics  . Alcohol use: Yes    Alcohol/week: 0.0 standard drinks  . Drug use: No     Medication list has been reviewed and updated.  Current Meds  Medication Sig  . aspirin EC 81 MG tablet Take 1 tablet (81 mg total) by mouth daily. Reported on 02/06/2016  . cyclobenzaprine (FLEXERIL) 5 MG tablet Take 1 tablet (5 mg total) by mouth 3 (three) times daily as needed for muscle spasms.  . meclizine (ANTIVERT) 12.5 MG tablet Take 1 tablet (12.5 mg total) by mouth 3 (three) times daily as needed for dizziness.  . Testosterone Cypionate 200 MG/ML SOLN Inject 0.75 cm2 as directed every 14 (fourteen) days.  . Turmeric, Curcuma Longa, POWD 1 scoop by Does not apply route daily.    PHQ 2/9 Scores 04/19/2020 10/01/2019 06/30/2019 03/18/2019  PHQ - 2 Score 0 0 1 0  PHQ- 9 Score 3 0 3 0    BP Readings from Last 3 Encounters:  04/19/20 110/62  10/01/19 122/82  07/20/19 120/70    Physical Exam Vitals and nursing note reviewed.  HENT:  Head: Normocephalic.     Right Ear: Tympanic membrane, ear canal and external ear normal. There is no impacted cerumen.     Left Ear: Tympanic membrane, ear canal and external ear normal. There is no impacted cerumen.     Nose: Nose normal. No congestion or rhinorrhea.     Mouth/Throat:     Mouth: Mucous membranes are moist.  Eyes:     General: No scleral icterus.       Right eye: No discharge.        Left eye: No discharge.     Conjunctiva/sclera: Conjunctivae normal.     Pupils: Pupils are equal, round, and reactive to light.    Neck:     Thyroid: No thyromegaly.     Vascular: No JVD.     Trachea: No tracheal deviation.  Cardiovascular:     Rate and Rhythm: Normal rate and regular rhythm.     Heart sounds: Normal heart sounds. No murmur. No friction rub. No gallop.   Pulmonary:     Effort: No respiratory distress.     Breath sounds: Normal breath sounds. No wheezing, rhonchi or rales.  Abdominal:     General: Bowel sounds are normal.     Palpations: Abdomen is soft. There is no mass.     Tenderness: There is no abdominal tenderness. There is no guarding or rebound.  Musculoskeletal:        General: No tenderness. Normal range of motion.     Cervical back: Normal range of motion and neck supple.  Lymphadenopathy:     Cervical: No cervical adenopathy.  Skin:    General: Skin is warm.     Capillary Refill: Capillary refill takes less than 2 seconds.     Findings: No rash.  Neurological:     Mental Status: He is alert and oriented to person, place, and time.     Cranial Nerves: No cranial nerve deficit.     Deep Tendon Reflexes: Reflexes are normal and symmetric.     Wt Readings from Last 3 Encounters:  04/19/20 162 lb (73.5 kg)  10/01/19 168 lb (76.2 kg)  07/20/19 167 lb (75.8 kg)    BP 110/62   Pulse 60   Ht 5\' 2"  (1.575 m)   Wt 162 lb (73.5 kg)   BMI 29.63 kg/m   Assessment and Plan:  1. Hormone replacement therapy (HRT) Chronic.  Stable.  Uncomplicated.  Patient is on gender affirmation hormone therapy consisting of testosterone cypionate 200 mg/mL for which he takes three quarters of a cc every 2 weeks.  We will obtain a CMP for evaluation of electrolytes GFR and hepatic concerns.  Patient will return in 6 months for reevaluation. - Testosterone Cypionate 200 MG/ML SOLN; Inject 0.75 cm2 as directed every 14 (fourteen) days.  Dispense: 10 mL; Refill: 2

## 2020-04-20 ENCOUNTER — Ambulatory Visit: Payer: Self-pay | Admitting: Family Medicine

## 2020-09-22 ENCOUNTER — Telehealth: Payer: Self-pay

## 2020-09-22 NOTE — Telephone Encounter (Signed)
Copied from CRM [id number]. Topic: General - Other >> Sep 22, 2020 11:11 AM Dalphine Handing A wrote: Patient is requesting a callback from [place] at [phone]. Please advise

## 2020-09-22 NOTE — Telephone Encounter (Signed)
I have tried every number to contact this pt with no luck

## 2020-10-20 ENCOUNTER — Ambulatory Visit: Payer: Self-pay | Admitting: Family Medicine

## 2021-01-09 ENCOUNTER — Ambulatory Visit (INDEPENDENT_AMBULATORY_CARE_PROVIDER_SITE_OTHER): Payer: PRIVATE HEALTH INSURANCE | Admitting: Family Medicine

## 2021-01-09 ENCOUNTER — Encounter: Payer: Self-pay | Admitting: Family Medicine

## 2021-01-09 ENCOUNTER — Other Ambulatory Visit: Payer: Self-pay

## 2021-01-09 VITALS — BP 120/80 | HR 84 | Ht 62.0 in | Wt 150.0 lb

## 2021-01-09 DIAGNOSIS — Z79890 Hormone replacement therapy (postmenopausal): Secondary | ICD-10-CM | POA: Diagnosis not present

## 2021-01-09 DIAGNOSIS — Z90722 Acquired absence of ovaries, bilateral: Secondary | ICD-10-CM

## 2021-01-09 DIAGNOSIS — R42 Dizziness and giddiness: Secondary | ICD-10-CM

## 2021-01-09 DIAGNOSIS — H6983 Other specified disorders of Eustachian tube, bilateral: Secondary | ICD-10-CM

## 2021-01-09 DIAGNOSIS — Z9079 Acquired absence of other genital organ(s): Secondary | ICD-10-CM

## 2021-01-09 DIAGNOSIS — Z90710 Acquired absence of both cervix and uterus: Secondary | ICD-10-CM

## 2021-01-09 MED ORDER — TESTOSTERONE CYPIONATE 200 MG/ML IJ SOLN: 0.7500 cm2 | INTRAMUSCULAR | 2 refills | Status: DC

## 2021-01-09 MED ORDER — MECLIZINE HCL 12.5 MG PO TABS: 12.5000 mg | ORAL_TABLET | Freq: Three times a day (TID) | ORAL | 2 refills | Status: AC | PRN

## 2021-01-09 NOTE — Progress Notes (Signed)
Date:  01/09/2021   Name:  Seth Conley   DOB:  Mar 10, 1981   MRN:  [id number]   Chief Complaint: hormaone replacement (Needs med), Dizziness, and Flu Vaccine  Patient is a 40 year old male who presents for a comprehensive physical exam. The patient reports the following problems: hormone affirmation therapy. Health maintenance has been reviewed up to date.  Dizziness This is a chronic problem. The current episode started more than 1 year ago. The problem occurs daily. The problem has been gradually improving. Associated symptoms include congestion and vertigo. Pertinent negatives include no abdominal pain, anorexia, arthralgias, change in bowel habit, chest pain, chills, coughing, diaphoresis, fatigue, fever, headaches, joint swelling, myalgias, nausea, neck pain, numbness, rash, sore throat, swollen glands, urinary symptoms, visual change, vomiting or weakness. Nothing aggravates the symptoms. He has tried nothing for the symptoms. The treatment provided mild relief.    Lab Results  Component Value Date   CREATININE 1.09 04/19/2020   BUN 18 04/19/2020   NA 137 04/19/2020   K 4.8 04/19/2020   CL 103 04/19/2020   CO2 27 04/19/2020   Lab Results  Component Value Date   CHOL 186 04/19/2020   HDL 35 (L) 04/19/2020   LDLCALC 96 04/19/2020   TRIG 276 (H) 04/19/2020   CHOLHDL 5.3 04/19/2020   No results found for: TSH No results found for: HGBA1C No results found for: WBC, HGB, HCT, MCV, PLT Lab Results  Component Value Date   ALT 20 04/19/2020   AST 20 04/19/2020   ALKPHOS 43 04/19/2020   BILITOT 0.3 04/19/2020     Review of Systems  Constitutional: Negative for chills, diaphoresis, fatigue and fever.  HENT: Positive for congestion. Negative for drooling, ear discharge, ear pain and sore throat.   Respiratory: Negative for cough, shortness of breath and wheezing.   Cardiovascular: Negative for chest pain, palpitations and leg swelling.  Gastrointestinal: Negative for  abdominal pain, anorexia, blood in stool, change in bowel habit, constipation, diarrhea, nausea and vomiting.  Endocrine: Negative for polydipsia.  Genitourinary: Negative for dysuria, frequency, hematuria and urgency.  Musculoskeletal: Negative for arthralgias, back pain, joint swelling, myalgias and neck pain.  Skin: Negative for rash.  Allergic/Immunologic: Negative for environmental allergies.  Neurological: Positive for dizziness and vertigo. Negative for weakness, numbness and headaches.  Hematological: Does not bruise/bleed easily.  Psychiatric/Behavioral: Negative for suicidal ideas. The patient is not nervous/anxious.     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  Current Meds  Medication Sig   aspirin EC  81 MG tablet Take 1 tablet (81 mg total) by mouth daily. Reported on 02/06/2016   cyclobenzaprine (FLEXERIL) 5 MG tablet Take 1 tablet (5 mg total) by mouth 3 (three) times daily as needed for muscle spasms.   meclizine (ANTIVERT) 12.5 MG tablet Take 1 tablet (12.5 mg total) by mouth 3 (three) times daily as  needed for dizziness.   Testosterone Cypionate 200 MG/ML SOLN Inject 0.75 cm2 as directed every 14 (fourteen) days.   Turmeric, Curcuma Longa, POWD 1 scoop by Does not apply route daily.   Vitamin D 12.5 MCG/0.25ML LIQD Take by mouth. neuro    PHQ 2/9 Scores 01/09/2021 04/19/2020 10/01/2019 06/30/2019  PHQ - 2 Score 0 0 0 1  PHQ- 9 Score 1 3 0 3    GAD 7 : Generalized Anxiety Score 01/09/2021 04/19/2020  Nervous, Anxious, on Edge 1 0  Control/stop worrying 0 0  Worry too much - different things 0 0  Trouble relaxing 0 0  Restless 0 0  Easily annoyed or irritable 0 1  Afraid - awful might happen 0 0  Total GAD 7 Score 1 1  Anxiety Difficulty - Not difficult at all    BP Readings from Last 3 Encounters:  01/09/21 120/80  04/19/20 110/62  10/01/19 122/82    Physical Exam Vitals and nursing note reviewed.  HENT:     Head: Normocephalic.     Right Ear: Tympanic membrane, ear canal and external ear normal. There is no impacted cerumen.     Left Ear: Tympanic membrane, ear canal and external ear normal. There is no impacted cerumen.     Nose: Nose normal. No congestion or rhinorrhea.     Mouth/Throat:     Mouth: Oropharynx is clear and moist. Mucous membranes are moist.  Eyes:     General: No scleral icterus.       Right eye: No discharge.        Left eye: No discharge.     Extraocular Movements: EOM normal.     Conjunctiva/sclera: Conjunctivae normal.     Pupils: Pupils are equal, round, and reactive to light.  Neck:     Thyroid: No thyromegaly.     Vascular: No JVD.     Trachea: No tracheal deviation.  Cardiovascular:     Rate and Rhythm: Normal rate and regular rhythm.     Pulses: Intact distal pulses.     Heart sounds: Normal heart sounds. No murmur heard. No friction rub. No gallop.   Pulmonary:     Effort: No respiratory distress.     Breath sounds: Normal breath sounds. No stridor. No wheezing, rhonchi or rales.  Chest:     Chest wall: No tenderness.  Abdominal:      General: Bowel sounds are normal.     Palpations: Abdomen is soft. There is no hepatosplenomegaly or mass.     Tenderness: There is no abdominal tenderness. There is no CVA tenderness, right CVA tenderness, left CVA tenderness, guarding or rebound.  Musculoskeletal:        General: No tenderness or edema. Normal range of motion.     Cervical back: Normal range of motion and neck supple.  Lymphadenopathy:     Cervical: No cervical adenopathy.  Skin:    General: Skin is warm.     Findings: No rash.  Neurological:     Mental Status: He is alert.     Cranial Nerves: No cranial nerve deficit.     Deep Tendon Reflexes:  Strength normal and reflexes are normal and symmetric.     Wt Readings from Last 3 Encounters:  01/09/21 150 lb (68 kg)  04/19/20 162 lb (73.5 kg)  10/01/19 168 lb (76.2 kg)    BP 120/80    Pulse 84    Ht 5\' 2"  (1.575 m)    Wt 150 lb (68 kg)    BMI 27.44 kg/m   Assessment and Plan: 1. Hormone replacement therapy (HRT) Chronic.  Controlled.  Stable.  Patient is doing well and tolerating present dosing of testosterone cypionate 200 mg/mL three-quarter cc every 2 weeks.  We will continue to do so we will recheck in 6 months - Testosterone Cypionate 200 MG/ML SOLN; Inject 0.75 cm2 as directed every 14 (fourteen) days.  Dispense: 10 mL; Refill: 2  2. Dizziness Patient is followed by ear nose and throat continues to have episodic vertigo.  We will represcribe meclizine 12.5 mg to be used on as-needed basis for vertigo. - meclizine (ANTIVERT) 12.5 MG tablet; Take 1 tablet (12.5 mg total) by mouth 3 (three) times daily as needed for dizziness.  Dispense: 30 tablet; Refill: 2  3. H/O total hysterectomy with removal of both tubes and ovaries Patient has had a total hysterectomy with removal of tubes and ovaries not requiring any further maintenance of her cervical cancer surveillance.  4. Eustachian tube dysfunction, bilateral Chronic.  Episodic.  Is followed by ear nose  and throat.  Patient has been suggested to use Sudafed 30 mg on an as-needed basis and resume his fluticasone nasal spray.

## 2021-06-03 ENCOUNTER — Other Ambulatory Visit: Payer: Self-pay | Admitting: Family Medicine

## 2021-06-03 DIAGNOSIS — Z79890 Hormone replacement therapy (postmenopausal): Secondary | ICD-10-CM

## 2021-06-03 NOTE — Telephone Encounter (Signed)
Requested medication (s) are due for refill today: yes  Requested medication (s) are on the active medication list: yes  Last refill:  01/09/21  Future visit scheduled: yes  Notes to clinic:  med not assigned to a protocol   Requested Prescriptions  Pending Prescriptions Disp Refills   testosterone cypionate (DEPOTESTOSTERONE CYPIONATE) 200 MG/ML injection [Pharmacy Med Name: TESTOSTERONE CYP 200MG /ML SDV 1ML] 10 mL     Sig: INJECT 0.75 MLS AS DIRECTED EVERY 14 DAYS      Off-Protocol Failed - 06/03/2021 12:00 PM      Failed - Medication not assigned to a protocol, review manually.      Passed - Valid encounter within last 12 months    Recent Outpatient Visits           4 months ago Hormone replacement therapy (HRT)   [facility] 08/04/2021, MD   1 year ago Hormone replacement therapy (HRT)   [facility] Duanne Limerick, MD   1 year ago Hormone replacement therapy (HRT)   [facility] Duanne Limerick, MD   1 year ago Strain of rectus abdominis muscle, initial encounter   [facility], MD   1 year ago Near syncope   [facility], MD       Future Appointments             In 1 month Duanne Limerick, MD [facility], [facility]

## 2021-06-11 ENCOUNTER — Other Ambulatory Visit: Payer: Self-pay | Admitting: Family Medicine

## 2021-06-11 DIAGNOSIS — Z79890 Hormone replacement therapy (postmenopausal): Secondary | ICD-10-CM

## 2021-06-11 NOTE — Telephone Encounter (Signed)
Medication Refill - Medication: Testosterone Cypionate 200 MG/ML SOLN  Has the patient contacted their pharmacy? Yes.   (Agent: If no, request that the patient contact the pharmacy for the refill.) (Agent: If yes, when and what did the pharmacy advise?) Refil expired   Preferred Pharmacy (with phone number or street name): [facility] [phone] [place], [place] - [address] [facility] [address]  [address] [place] [address]  Phone:  [phone]  Fax:  [phone]   Agent: Please be advised that RX refills may take up to 3 business days. We ask that you follow-up with your pharmacy.

## 2021-06-12 ENCOUNTER — Telehealth: Payer: Self-pay

## 2021-06-12 NOTE — Telephone Encounter (Signed)
Made a call to pharmacy- they will fill his testosterone

## 2021-06-12 NOTE — Telephone Encounter (Signed)
   Requested medications are on the active medication list yes   Last visit 01/09/21  Future visit scheduled 07/10/21  Notes to clinic No protocol for this med, however, no testosterone level since 2018, please assess.

## 2021-07-10 ENCOUNTER — Ambulatory Visit: Payer: Medicaid Other | Admitting: Family Medicine

## 2021-07-17 ENCOUNTER — Ambulatory Visit: Payer: Medicaid Other | Admitting: Family Medicine

## 2021-08-31 ENCOUNTER — Ambulatory Visit (INDEPENDENT_AMBULATORY_CARE_PROVIDER_SITE_OTHER): Payer: Self-pay | Admitting: Family Medicine

## 2021-08-31 ENCOUNTER — Other Ambulatory Visit
Admission: RE | Admit: 2021-08-31 | Discharge: 2021-08-31 | Disposition: A | Discharge status: Home / Self Care | Payer: No Typology Code available for payment source | Attending: Family Medicine | Admitting: Family Medicine

## 2021-08-31 ENCOUNTER — Other Ambulatory Visit: Payer: Self-pay

## 2021-08-31 ENCOUNTER — Encounter: Payer: Self-pay | Admitting: Family Medicine

## 2021-08-31 VITALS — BP 124/80 | HR 76 | Ht 62.0 in | Wt 138.0 lb

## 2021-08-31 DIAGNOSIS — E781 Pure hyperglyceridemia: Secondary | ICD-10-CM | POA: Insufficient documentation

## 2021-08-31 DIAGNOSIS — Z79890 Hormone replacement therapy (postmenopausal): Secondary | ICD-10-CM

## 2021-08-31 DIAGNOSIS — H6993 Unspecified Eustachian tube disorder, bilateral: Secondary | ICD-10-CM

## 2021-08-31 DIAGNOSIS — Z23 Encounter for immunization: Secondary | ICD-10-CM

## 2021-08-31 DIAGNOSIS — H6983 Other specified disorders of Eustachian tube, bilateral: Secondary | ICD-10-CM

## 2021-08-31 LAB — COMPREHENSIVE METABOLIC PANEL
ALT: 22 U/L (ref 0–44)
AST: 20 U/L (ref 15–41)
Albumin: 4.5 g/dL (ref 3.5–5.0)
Alkaline Phosphatase: 38 U/L (ref 38–126)
Anion gap: 5 (ref 5–15)
BUN: 16 mg/dL (ref 6–20)
CO2: 30 mmol/L (ref 22–32)
Calcium: 9.1 mg/dL (ref 8.9–10.3)
Chloride: 100 mmol/L (ref 98–111)
Creatinine, Ser: 0.88 mg/dL (ref 0.61–1.24)
GFR, Estimated: >60 mL/min (ref >60)
Glucose, Bld: 102 mg/dL — ABNORMAL HIGH (ref 70–99)
Potassium: 4.5 mmol/L (ref 3.5–5.1)
Sodium: 135 mmol/L (ref 135–145)
Total Bilirubin: 0.6 mg/dL (ref 0.3–1.2)
Total Protein: 7.7 g/dL (ref 6.5–8.1)

## 2021-08-31 LAB — LIPID PANEL
Cholesterol: 167 mg/dL (ref 0–200)
HDL: 40 mg/dL — ABNORMAL LOW (ref >40)
LDL Cholesterol: 93 mg/dL (ref 0–99)
Total CHOL/HDL Ratio: 4.2 RATIO
Triglycerides: 172 mg/dL — ABNORMAL HIGH (ref <150)
VLDL: 34 mg/dL (ref 0–40)

## 2021-08-31 MED ORDER — MONTELUKAST SODIUM 10 MG PO TABS: 10.0000 mg | ORAL_TABLET | Freq: Every day | ORAL | 11 refills | Status: DC

## 2021-08-31 MED ORDER — TESTOSTERONE CYPIONATE 200 MG/ML IJ SOLN
0.7500 cm2 | INTRAMUSCULAR | 2 refills | Status: DC
Start: 2021-08-31 — End: 2021-09-20

## 2021-08-31 NOTE — Progress Notes (Signed)
Date:  08/31/2021   Name:  Seth Conley   DOB:  01-04-1981   MRN:  [id number]   Chief Complaint: Flu Vaccine and hormone replacement therapy  Patient is a 40 year old male who presents for a comprehensive physical exam. The patient reports the following problems: gender affirmation hormone therapy. Health maintenance has been reviewed up todate.     Lab Results  Component Value Date   CREATININE 1.09 04/19/2020   BUN 18 04/19/2020   NA 137 04/19/2020   K 4.8 04/19/2020   CL 103 04/19/2020   CO2 27 04/19/2020   Lab Results  Component Value Date   CHOL 186 04/19/2020   HDL 35 (L) 04/19/2020   LDLCALC 96 04/19/2020   TRIG 276 (H) 04/19/2020   CHOLHDL 5.3 04/19/2020   No results found for: TSH No results found for: HGBA1C No results found for: WBC, HGB, HCT, MCV, PLT Lab Results  Component Value Date   ALT 20 04/19/2020   AST 20 04/19/2020   ALKPHOS 43 04/19/2020   BILITOT 0.3 04/19/2020     Review of Systems  Constitutional:  Negative for chills and fever.  HENT:  Positive for ear discharge and tinnitus. Negative for drooling, ear pain and sore throat.   Respiratory:  Negative for cough, shortness of breath and wheezing.   Cardiovascular:  Negative for chest pain, palpitations and leg swelling.  Gastrointestinal:  Negative for abdominal pain, blood in stool, constipation, diarrhea and nausea.  Endocrine: Negative for polydipsia.  Genitourinary:  Negative for dysuria, frequency, hematuria and urgency.  Musculoskeletal:  Negative for back pain, myalgias and neck pain.  Skin:  Negative for rash.  Allergic/Immunologic: Negative for environmental allergies.  Neurological:  Negative for dizziness and headaches.  Hematological:  Does not bruise/bleed easily.  Psychiatric/Behavioral:  Negative for suicidal ideas. The patient is not nervous/anxious.    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  Current Meds  Medication Sig   meclizine (ANTIVERT) 12.5 MG tablet Take 1 tablet (12.5 mg total) by mouth 3 (three) times daily as needed for dizziness.   Testosterone Cypionate 200 MG/ML SOLN Inject 0.75 cm2 as directed every 14 (fourteen) days.   Turmeric, Curcuma Longa, POWD 1 scoop by Does not apply route daily.   vitamin B-12 (CYANOCOBALAMIN) 1000 MCG tablet Take 1,000 mcg by mouth daily.   Vitamin D 12.5 MCG/0.25ML LIQD Take by mouth. neuro   [DISCONTINUED] cyclobenzaprine (FLEXERIL) 5 MG tablet Take 1 tablet (5 mg total)  by mouth 3 (three) times daily as needed for muscle spasms.    PHQ 2/9 Scores 01/09/2021 04/19/2020 10/01/2019 06/30/2019  PHQ - 2 Score 0 0 0 1  PHQ- 9 Score 1 3 0 3    GAD 7 : Generalized Anxiety Score 01/09/2021 04/19/2020  Nervous, Anxious, on Edge 1 0  Control/stop worrying 0 0  Worry too much - different things 0 0  Trouble relaxing 0 0  Restless 0 0  Easily annoyed or irritable 0 1  Afraid - awful might happen 0 0  Total GAD 7 Score 1 1  Anxiety Difficulty - Not difficult at all    BP Readings from Last 3 Encounters:  08/31/21 124/80  01/09/21 120/80   04/19/20 110/62    Physical Exam Vitals and nursing note reviewed.  HENT:     Head: Normocephalic.     Right Ear: Tympanic membrane and external ear normal. There is no impacted cerumen.     Left Ear: Tympanic membrane and external ear normal. There is no impacted cerumen.     Nose: Nose normal. No congestion or rhinorrhea.  Eyes:     General: No scleral icterus.       Right eye: No discharge.        Left eye: No discharge.     Conjunctiva/sclera: Conjunctivae normal.     Pupils: Pupils are equal, round, and reactive to light.  Neck:     Thyroid: No thyromegaly.     Vascular: No JVD.     Trachea: No tracheal deviation.  Cardiovascular:     Rate and Rhythm: Normal rate and regular rhythm.     Heart sounds: Normal heart sounds. No murmur heard.   No friction rub. No gallop.  Pulmonary:     Effort: No respiratory distress.     Breath sounds: Normal breath sounds. No wheezing or rales.  Abdominal:     General: Bowel sounds are normal.     Palpations: Abdomen is soft. There is no mass.     Tenderness: There is no abdominal tenderness. There is no guarding or rebound.  Musculoskeletal:        General: No tenderness. Normal range of motion.     Cervical back: Normal range of motion and neck supple.  Lymphadenopathy:     Cervical: No cervical adenopathy.  Skin:    General: Skin is warm.     Findings: No rash.  Neurological:     Mental Status: He is alert and oriented to person, place, and time.     Cranial Nerves: No cranial nerve deficit.     Deep Tendon Reflexes: Reflexes are normal and symmetric.    Wt Readings from Last 3 Encounters:  08/31/21 138 lb (62.6 kg)  01/09/21 150 lb (68 kg)  04/19/20 162 lb (73.5 kg)    BP 124/80   Pulse 76   Ht 5\' 2"  (1.575 m)   Wt 138 lb (62.6 kg)   BMI 25.24 kg/m   Assessment and Plan:  1. Hormone replacement therapy (HRT) Chronic.  Controlled.  Stable.  We will continue on current dosing of testosterone cypionate 200 mg/cc  three quarters of a cc every 2 weeks. - Testosterone Cypionate 200 MG/ML SOLN; Inject 0.75 cm2 as directed every 14 (fourteen) days.  Dispense: 10 mL; Refill: 2  2. Hypertriglyceridemia without hypercholesterolemia Review of lipid panel notes elevated triglycerides in the past and we will check lipid panel for current level of triglycerides.  3. Eustachian tube dysfunction,  bilateral Patient's noted to have bilateral retraction of tympanic membranes consistent with eustachian tube dysfunction.  In addition to antihistamine and nasal steroid it has been suggested that he may benefit from Singulair 10 mg as needed. - montelukast (SINGULAIR) 10 MG tablet; Take 1 tablet (10 mg total) by mouth at bedtime.  Dispense: 30 tablet; Refill: 11  4. Need for immunization against influenza Discussed and administered - Flu Vaccine QUAD 2mo+IM (Fluarix, Fluzone & Alfiuria Quad PF)

## 2021-09-17 ENCOUNTER — Telehealth: Payer: Self-pay

## 2021-09-17 NOTE — Telephone Encounter (Signed)
Copied from CRM [id number]. Topic: General - Other >> Sep 17, 2021  1:48 PM Pawlus, [name] wrote: Reason for CRM: Pt called in requesting to speak with Delice Bison to go over his medications, pt stated the wrong information was sent to the pharmacy regarding his Testosterone Cypionate 200 MG/ML SOLN, please advise.

## 2021-09-19 NOTE — Telephone Encounter (Signed)
Pt called in to follow up on concern. Advised pt that Seth Conley has gone for the day. Verified phone number on file. Pt says that he would also like to discuss his Triglycerides. Pt says that he reviewed his results on mychart and is not understanding why his number is so high. Pt would like to know what could he do to have better result?

## 2021-09-20 ENCOUNTER — Other Ambulatory Visit: Payer: Self-pay | Admitting: Family Medicine

## 2021-09-20 ENCOUNTER — Other Ambulatory Visit: Payer: Self-pay

## 2021-09-20 DIAGNOSIS — Z79890 Hormone replacement therapy (postmenopausal): Secondary | ICD-10-CM

## 2021-09-20 DIAGNOSIS — R69 Illness, unspecified: Secondary | ICD-10-CM

## 2021-09-20 MED ORDER — TESTOSTERONE CYPIONATE 100 MG/ML IJ SOLN: 100.0000 mg | INTRAMUSCULAR | 1 refills | Status: DC

## 2021-09-20 NOTE — Progress Notes (Unsigned)
testosterone

## 2021-09-20 NOTE — Telephone Encounter (Signed)
Patient requesting returning Seth Conley call best # to call back [phone]. Patient states he has [phone] #s  blocked on his phone.

## 2022-01-09 ENCOUNTER — Ambulatory Visit: Payer: Self-pay | Admitting: Family Medicine

## 2022-02-18 ENCOUNTER — Other Ambulatory Visit: Payer: Self-pay

## 2022-02-18 ENCOUNTER — Encounter: Payer: Self-pay | Admitting: Family Medicine

## 2022-02-18 ENCOUNTER — Ambulatory Visit (INDEPENDENT_AMBULATORY_CARE_PROVIDER_SITE_OTHER): Payer: Self-pay | Admitting: Family Medicine

## 2022-02-18 VITALS — BP 120/60 | HR 64 | Ht 62.0 in | Wt 136.0 lb

## 2022-02-18 DIAGNOSIS — R634 Abnormal weight loss: Secondary | ICD-10-CM

## 2022-02-18 DIAGNOSIS — E781 Pure hyperglyceridemia: Secondary | ICD-10-CM

## 2022-02-18 DIAGNOSIS — Z79890 Hormone replacement therapy (postmenopausal): Secondary | ICD-10-CM

## 2022-02-18 MED ORDER — TESTOSTERONE CYPIONATE 100 MG/ML IJ SOLN: 100.0000 mg | INTRAMUSCULAR | 1 refills | Status: DC

## 2022-02-18 NOTE — Patient Instructions (Signed)
GUIDELINES FOR  ?LOW-CHOLESTEROL, LOW-TRIGLYCERIDE DIETS  ?  ?FOODS TO USE  ? ?MEATS, FISH Choose lean meats (chicken, turkey, veal, and non-fatty cuts of beef with excess fat trimmed; one serving = 3 oz of cooked meat). Also, fresh or frozen fish, canned fish packed in water, and shellfish (lobster, crabs, shrimp, and oysters). Limit use to no more than one serving of one of these per week. Shellfish are high in cholesterol but low in saturated fat and should be used sparingly. Meats and fish should be broiled (pan or oven) or baked on a rack.  ?EGGS Egg substitutes and egg whites (use freely). Egg yolks (limit two per week).  ?FRUITS Eat three servings of fresh fruit per day (1 serving = ? cup). Be sure to have at least one citrus fruit daily. Frozen and canned fruit with no sugar or syrup added may be used.  ?VEGETABLES Most vegetables are not limited (see next page). One dark-green (string beans, escarole) or one deep yellow (squash) vegetable is recommended daily. Cauliflower, broccoli, and celery, as well as potato skins, are recommended for their fiber content. (Fiber is associated with cholesterol reduction) It is preferable to steam vegetables, but they may be boiled, strained, or braised with polyunsaturated vegetable oil (see below).  ?BEANS Dried peas or beans (1 serving = ? cup) may be used as a bread substitute.  ?NUTS Almonds, walnuts, and peanuts may be used sparingly  ?(1 serving = 1 Tablespoonful). Use pumpkin, sesame, or sunflower seeds.  ?BREADS, GRAINS One roll or one slice of whole grain or enriched bread may be used, or three soda crackers or four pieces of melba toast as a substitute. Spaghetti, rice or noodles (? cup) or ? large ear of corn may be used as a bread substitute. In preparing these foods do not use butter or shortening, use soft margarine. Also use egg and sugar substitutes.  Choose high fiber grains, such as oats and whole wheat.  ?CEREALS Use ? cup of hot cereal or ? cup of  cold cereal per day. Add a sugar substitute if desired, with 99% fat free or skim milk.  ?MILK PRODUCTS Always use 99% fat free or skim milk, dairy products such as low fat cheeses (farmer's uncreamed diet cottage), low-fat yogurt, and powdered skim milk.  ?FATS, OILS Use soft (not stick) margarine; vegetable oils that are high in polyunsaturated fats (such as safflower, sunflower, soybean, corn, and cottonseed). Always refrigerate meat drippings to harden the fat and remove it before preparing gravies  ?DESSERTS, SNACKS Limit to two servings per day; substitute each serving for a bread/cereal serving: ice milk, water sherbet (1/4 cup); unflavored gelatin or gelatin flavored with sugar substitute (1/3 cup); pudding prepared with skim milk (1/2 cup); egg white souffl?s; unbuttered popcorn (1 ? cups). Substitute carob for chocolate.  ?BEVERAGES Fresh fruit juices (limit 4 oz per day); black coffee, plain or herbal teas; soft drinks with sugar substitutes; club soda, preferably salt-free; cocoa made with skim milk or nonfat dried milk and water (sugar substitute added if desired); clear broth. Alcohol: limit two servings per day (see second page).  ?MISCELLANEOUS ? You may use the following freely: vinegar, spices, herbs, nonfat bouillon, mustard, Worcestershire sauce, soy sauce, flavoring essence.  ? ? ? ? ? ? ? ? ? ? ? ? ? ? ? ? ?GUIDELINES FOR  ?LOW-CHOLESTEROL, LOW TRIGLYCERIDE DIETS  ?  ?FOODS TO AVOID  ? ?MEATS, FISH Marbled beef, pork, bacon, sausage, and other pork products; fatty   fowl (duck, goose); skin and fat of turkey and chicken; processed meats; luncheon meats (salami, bologna); frankfurters and fast-food hamburgers (theyre loaded with fat); organ meats (kidneys, liver); canned fish packed in oil.  ?EGGS Limit egg yolks to two per week.   ?FRUITS Coconuts (rich in saturated fats).  ?VEGETABLES Avoid avocados. Starchy vegetables (potatoes, corn, lima beans, dried peas, beans) may be used only if  substitutes for a serving of bread or cereal. (Baked potato skin, however, is desirable for its fiber content.  ?BEANS Commercial baked beans with sugar and/or pork added.  ?NUTS Avoid nuts.  Limit peanuts and walnuts to one tablespoonful per day.  ?BREADS, GRAINS Any baked goods with shortening and/or sugar. Commercial mixes with dried eggs and whole milk. Avoid sweet rolls, doughnuts, breakfast pastries (Danish), and sweetened packaged cereals (the added sugar converts readily to triglycerides).  ?MILK PRODUCTS Whole milk and whole-milk packaged goods; cream; ice cream; whole-milk puddings, yogurt, or cheeses; nondairy cream substitutes.  ?FATS, OILS Butter, lard, animal fats, bacon drippings, gravies, cream sauces as well as palm and coconut oils. All these are high in saturated fats. Examine labels on cholesterol free products for hydrogenated fats. (These are oils that have been hardened into solids and in the process have become saturated.)  ?DESSERTS, SNACKS Fried snack foods like potato chips; chocolate; candies in general; jams, jellies, syrups; whole- milk puddings; ice cream and milk sherbets; hydrogenated peanut butter.  ?BEVERAGES Sugared fruit juices and soft drinks; cocoa made with whole milk and/or sugar. When using alcohol (1 oz liquor, 5 oz beer, or 2 ? oz dry table wine per serving), one serving must be substituted for one bread or cereal serving (limit, two servings of alcohol per day).  ? SPECIAL NOTES  ?  Remember that even non-limited foods should be used in moderation. ?While on a cholesterol-lowering diet, be sure to avoid animal fats and marbled meats. ?3. While on a triglyceride-lowering diet, be sure to avoid sweets and to control the amount of carbohydrates you eat (starchy foods such as flour, bread, potatoes).While on a tri-glyceride-lowering diet, be sure to avoid sweets ?Buy a good low-fat cookbook, such as the one published by the American Heart Association. ?Consult your physician  if you have any questions.  ? ? ? ? ? ? ? ? ? ? ? ? ? ?[facility] Low Glycemic Diet Plan ? ? ?Low Glycemic Foods (20-49) Moderate Glycemic Foods (50-69) High Glycemic Foods (70-100)  ?    ?Breakfast Creals Breakfast Cereals Breakfast Cereals  ?All Bran All-Bran Fruit'n Oats  ? Bran Buds Bran Chex  ? Cheerios Corn chex  ?  ?Fiber One Oatmeal (not instant)  ? Just Right Mini-Wheats  ? Corn Flakes Cream of Wheat  ?  ?Oat Bran Special K Swiss Muesli  ? Grape Nuts Grape Nut Flakes  ?  ?  Grits Nutri-Grain  ?  ?Fruits and fruit juice: Fruits Puffed Rice Puffed Wheat  ?  ?(Limit to 1-2 Servings per day) Banana (under-ride) Dates  ? Rice Chex Rice Krispies  ?  ?Apples Apricots (fresh/dried)  ? Figs Grapes  ? Shredded Wheat Team  ?  ?Blackberries Blueberries  ? Kiwi Mango  ? Total   ?  ?Cherries Cranberries  ? Oranges Raisins  ?   ?Peaches Pears  ?  Fruits  ?Plums Prunes  ? Fruit Juices Pineapple Watermelon  ?  ?Grapefruit Raspberries  ? Cranberry Juice Orange Juice  ? Banana (over-ripe)   ?  ?Strawberries Tangerines  ?    ?  Apple Juice Grapefruit Juice  ? Beans and Legumes Beverages  ?Tomato Juice   ? [place]-type baked beans Sodas, sweet tea, pineapple juice  ? Canned pinto, kidney, or navy beans   ?Beans and Legumes (fresh-cooked) Green peas Vegetables  ?Black-eyed peas Butter Beans  ?  Potato, baked, boiled, fried, mashed  ?Chick peas Lentils  ? Vegetables French fries  ?Green beans Lima beans  ? Beets Carrots  ? Canned or frozen corn  ?Kidney beans Navy beans  ? Sweet potato Yam  ? Parsnips  ?Pinto beans Snow peas  ? Corn on the cob Winter squash  ?    ?Non-starchy vegetables Grains Breads  ?Asparagus, avocado, broccoli, cabbage Cornmeal Rice, brown  ? Most breads (white and whole grain)  ?cauliflower, celery, cucumber, greens Rice, white Couscous  ? Bagels Bread sticks  ?  ?lettuce, mushrooms, peppers, tomatoes  Bread stuffing Kaiser roll  ?  ?okra, onions, spinach, summer squash Pasta Dinner rolls  ? Macaroni  Pizza, cheese  ?   ?Grains Ravioli, meat filled Spaghetti, white  ? Grains  ?Barley Bulgur  ?  Rice, instant Tapioca, with milk  ?  ?Rye Wild rice  ? Nuts   ? Cashews Macadamia  ? Candy and most cookies  ?Nuts and o

## 2022-02-18 NOTE — Progress Notes (Addendum)
? ? ?Date:  02/18/2022  ? ?Name:  Seth Conley   DOB:  1980-12-12   MRN:  [id number] ? ? ?Chief Complaint: hormone replacement therapy ? ?Patient is a 41 year old male who presents for a hormonr replacrment exam. The patient reports the following problems: none. Health maintenance has been reviewed up to date. ?  ? ? ?Lab Results  ?Component Value Date  ? NA 135 08/31/2021  ? K 4.5 08/31/2021  ? CO2 30 08/31/2021  ? GLUCOSE 102 (H) 08/31/2021  ? BUN 16 08/31/2021  ? CREATININE 0.88 08/31/2021  ? CALCIUM 9.1 08/31/2021  ? GFRNONAA >60 08/31/2021  ? ?Lab Results  ?Component Value Date  ? CHOL 167 08/31/2021  ? HDL 40 (L) 08/31/2021  ? LDLCALC 93 08/31/2021  ? TRIG 172 (H) 08/31/2021  ? CHOLHDL 4.2 08/31/2021  ? ?No results found for: TSH ?No results found for: HGBA1C ?No results found for: WBC, HGB, HCT, MCV, PLT ?Lab Results  ?Component Value Date  ? ALT 22 08/31/2021  ? AST 20 08/31/2021  ? ALKPHOS 38 08/31/2021  ? BILITOT 0.6 08/31/2021  ? ?No results found for: 25OHVITD2, 25OHVITD3, VD25OH  ? ?Review of Systems  ?Constitutional:  Negative for chills and fever.  ?HENT:  Negative for drooling, ear discharge, ear pain and sore throat.   ?Respiratory:  Negative for cough, shortness of breath and wheezing.   ?Cardiovascular:  Negative for chest pain, palpitations and leg swelling.  ?Gastrointestinal:  Negative for abdominal pain, blood in stool, constipation, diarrhea and nausea.  ?Endocrine: Negative for polydipsia.  ?Genitourinary:  Negative for dysuria, frequency, hematuria and urgency.  ?Musculoskeletal:  Negative for back pain, myalgias and neck pain.  ?Skin:  Negative for rash.  ?Allergic/Immunologic: Negative for environmental allergies.  ?Neurological:  Negative for dizziness and headaches.  ?Hematological:  Does not bruise/bleed easily.  ?Psychiatric/Behavioral:  Negative for suicidal ideas. The patient is not nervous/anxious.   ? ?Patient Active Problem List  ? Diagnosis Date Noted  ? H/O total  hysterectomy with removal of both tubes and ovaries 01/09/2021  ? Hormone replacement therapy (HRT) 07/14/2018  ? Acute pain of left knee 01/23/2017  ? Peripheral vertigo 07/26/2015  ? 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 ? ?Allergies  ?Allergen Reactions  ? Amoxicillin Hives and Rash  ? Hydrocodone Shortness Of Breath  ? Sulfa Antibiotics Anaphylaxis  ? Clindamycin Hives  ? Penicillins Rash  ? ? ?Past Surgical History:  ?Procedure Laterality Date  ? VAGINAL HYSTERECTOMY  2015  ? ? ?Social History  ? ?Tobacco Use  ? Smoking status: Former  ?  Types: Cigarettes  ?  Quit date: 03/12/2019  ?  Years since quitting: 2.9  ? Smokeless tobacco: Never  ? Tobacco comments:  ?  using lozenges  ?Substance Use Topics  ? Alcohol use: Yes  ?  Alcohol/week: 0.0 standard drinks  ? Drug use: No  ? ? ? ?Medication list has been reviewed and updated. ? ?Current Meds  ?Medication Sig  ? aspirin EC 81 MG tablet Take 1 tablet (81 mg total) by mouth daily. Reported on 02/06/2016  ? Testosterone Cypionate 100 MG/ML SOLN Inject 100 mg as directed every 14 (fourteen) days.  ? Turmeric, Curcuma Longa, POWD 1 scoop by Does not apply route daily.  ? vitamin B-12 (CYANOCOBALAMIN) 1000 MCG tablet Take 1,000 mcg by mouth daily.  ? Vitamin D 12.5 MCG/0.25ML LIQD Take by mouth. neuro  ? ? ?PHQ 2/9 Scores 02/18/2022 01/09/2021 04/19/2020  10/01/2019  ?PHQ - 2 Score 0 0 0 0  ?PHQ- 9 Score 1 1 3  0  ? ? ?GAD 7 : Generalized Anxiety Score 02/18/2022 01/09/2021 04/19/2020  ?Nervous, Anxious, on Edge 1 1 0  ?Control/stop worrying 1 0 0  ?Worry too much - different things 1 0 0  ?Trouble relaxing 1 0 0  ?Restless 1 0 0  ?Easily annoyed or irritable 2 0 1  ?Afraid - awful might happen 1 0 0  ?Total GAD 7 Score 8 1 1   ?Anxiety Difficulty Somewhat difficult - Not difficult at all  ? ? ?BP Readings from Last 3 Encounters:  ?02/18/22 120/60  ?08/31/21 124/80  ?01/09/21 120/80  ? ? ?Physical Exam ?Vitals and nursing note reviewed.  ?HENT:  ?   Head:  Normocephalic.  ?   Right Ear: Tympanic membrane, ear canal and external ear normal. There is no impacted cerumen.  ?   Left Ear: Tympanic membrane, ear canal and external ear normal. There is no impacted cerumen.  ?   Nose: Nose normal. No congestion or rhinorrhea.  ?Eyes:  ?   General: No scleral icterus.    ?   Right eye: No discharge.     ?   Left eye: No discharge.  ?   Conjunctiva/sclera: Conjunctivae normal.  ?   Pupils: Pupils are equal, round, and reactive to light.  ?Neck:  ?   Thyroid: No thyromegaly.  ?   Vascular: No JVD.  ?   Trachea: No tracheal deviation.  ?Cardiovascular:  ?   Rate and Rhythm: Normal rate and regular rhythm.  ?   Heart sounds: Normal heart sounds. No murmur heard. ?  No friction rub. No gallop.  ?Pulmonary:  ?   Effort: No respiratory distress.  ?   Breath sounds: Normal breath sounds. No wheezing, rhonchi or rales.  ?Abdominal:  ?   General: Bowel sounds are normal.  ?   Palpations: Abdomen is soft. There is no hepatomegaly, splenomegaly or mass.  ?   Tenderness: There is no abdominal tenderness. There is no guarding or rebound.  ?Musculoskeletal:     ?   General: No tenderness. Normal range of motion.  ?   Cervical back: Normal range of motion and neck supple.  ?Lymphadenopathy:  ?   Cervical: No cervical adenopathy.  ?Skin: ?   General: Skin is warm.  ?   Findings: No rash.  ?Neurological:  ?   Mental Status: He is alert and oriented to person, place, and time.  ?   Cranial Nerves: No cranial nerve deficit.  ?   Deep Tendon Reflexes: Reflexes are normal and symmetric.  ? ? ?Wt Readings from Last 3 Encounters:  ?02/18/22 136 lb (61.7 kg)  ?08/31/21 138 lb (62.6 kg)  ?01/09/21 150 lb (68 kg)  ? ? ?BP 120/60   Pulse 64   Ht 5\' 2"  (1.575 m)   Wt 136 lb (61.7 kg)   BMI 24.87 kg/m?  ? ?Assessment and Plan: ? ? ?1. Hormone replacement therapy (HRT) ?Chronic.  Controlled.  Stable.  Patient is on hormone replacement treatment for gender affirmation.  We will continue testosterone  cypionate at current dosing every 14 days.  We will recheck in 6 months. ?- Testosterone Cypionate 100 MG/ML SOLN; Inject 100 mg as directed every 14 (fourteen) days.  Dispense: 10 mL; Refill: 1 ? ?2. Hypertriglyceridemia without hypercholesterolemia ?Chronic.  Controlled.  Patient is only able to do diet he is reluctant to take medications  in the past.  We will recheck lipid and then determine which direction we go from there. ?- Lipid Panel With LDL/HDL Ratio ? ?3. Weight loss ?Patient has had some weight loss but this has been by design.  Patient has been eating better and states that he feels that his weight loss is due to his dietary discretion. ?

## 2022-02-19 LAB — LIPID PANEL WITH LDL/HDL RATIO
Cholesterol, Total: 184 mg/dL (ref 100–199)
HDL: 49 mg/dL (ref >39)
LDL Chol Calc (NIH): 98 mg/dL (ref 0–99)
LDL/HDL Ratio: 2 ratio (ref 0.0–3.6)
Triglycerides: 218 mg/dL — ABNORMAL HIGH (ref 0–149)
VLDL Cholesterol Cal: 37 mg/dL (ref 5–40)

## 2022-03-16 ENCOUNTER — Encounter: Payer: Self-pay | Admitting: Family Medicine

## 2022-03-18 ENCOUNTER — Other Ambulatory Visit: Payer: Self-pay | Admitting: Family Medicine

## 2022-03-18 DIAGNOSIS — Z79890 Hormone replacement therapy (postmenopausal): Secondary | ICD-10-CM

## 2022-03-18 NOTE — Telephone Encounter (Signed)
Medication Refill - Medication: Testosterone Cypionate 100 MG/ML SOLN [id number]  ? ?Has the patient contacted their pharmacy? Yes.  The pharmacy states that they have no record that this was sent to the pharmacy. ? ? ?Preferred Pharmacy (with phone number or street name):  ?[facility] [phone] - [address], [address]  ?[place] [place] [address]  ?Phone: [phone] Fax: [phone]  ?Hours: Not open 24 hours  ? ? ?Has the patient been seen for an appointment in the last year OR does the patient have an upcoming appointment? Yes.  02/18/22 ? ?Agent: Please be advised that RX refills may take up to 3 business days. We ask that you follow-up with your pharmacy. ? ?

## 2022-03-18 NOTE — Telephone Encounter (Signed)
Requested medication (s) are due for refill today:   No ? ?Requested medication (s) are on the active medication list:   Yes ? ?Future visit scheduled:   Yes tomorrow Tues. ? ? ?Last ordered: 02/18/2022 10 ml, 1 refill ? ?Non delegated refill    No protocol assigned either.  ? ?Requested Prescriptions  ?Pending Prescriptions Disp Refills  ? Testosterone Cypionate 100 MG/ML SOLN 10 mL 1  ?  Sig: Inject 100 mg as directed every 14 (fourteen) days.  ?  ? Off-Protocol Failed - 03/18/2022 12:28 PM  ?  ?  Failed - Medication not assigned to a protocol, review manually.  ?  ?  Passed - Valid encounter within last 12 months  ?  Recent Outpatient Visits   ? ?      ? 4 weeks ago Hormone replacement therapy (HRT)  ? [facility] Juline Patch, MD  ? 6 months ago Hormone replacement therapy (HRT)  ? [facility] [place] Juline Patch, MD  ? 1 year ago Hormone replacement therapy (HRT)  ? [facility] Juline Patch, MD  ? 1 year ago Hormone replacement therapy (HRT)  ? [facility] Juline Patch, MD  ? 2 years ago Hormone replacement therapy (HRT)  ? [facility] Juline Patch, MD  ? ?  ?  ?Future Appointments   ? ?        ? Tomorrow Juline Patch, MD [facility], [place]  ? In 5 months Juline Patch, MD [facility], [place]  ? ?  ? ? ?  ?  ?  ? ?

## 2022-03-19 ENCOUNTER — Encounter: Payer: Self-pay | Admitting: Family Medicine

## 2022-03-19 ENCOUNTER — Ambulatory Visit: Payer: Self-pay | Admitting: Family Medicine

## 2022-03-19 ENCOUNTER — Ambulatory Visit (INDEPENDENT_AMBULATORY_CARE_PROVIDER_SITE_OTHER): Payer: Self-pay | Admitting: Family Medicine

## 2022-03-19 NOTE — Progress Notes (Signed)
Patient was under the impression that he had to come in for a hepatitis C screening due to seeing this on his mychart. After discussing the above with him, he has not had exposure nor is he concerned about getting his hepatitis C drawn. I also gave him information about [facility] and the next drawing they are hosting at [facility] on 03/26/22. Pt was not seen today by the doctor.  ?

## 2022-08-21 ENCOUNTER — Encounter: Payer: Self-pay | Admitting: Family Medicine

## 2022-08-21 ENCOUNTER — Ambulatory Visit (INDEPENDENT_AMBULATORY_CARE_PROVIDER_SITE_OTHER): Payer: Self-pay | Admitting: Family Medicine

## 2022-08-21 VITALS — BP 120/80 | HR 80 | Ht 62.0 in | Wt 145.0 lb

## 2022-08-21 DIAGNOSIS — G8929 Other chronic pain: Secondary | ICD-10-CM

## 2022-08-21 DIAGNOSIS — M545 Low back pain, unspecified: Secondary | ICD-10-CM

## 2022-08-21 DIAGNOSIS — Z23 Encounter for immunization: Secondary | ICD-10-CM

## 2022-08-21 DIAGNOSIS — Z79890 Hormone replacement therapy (postmenopausal): Secondary | ICD-10-CM

## 2022-08-21 MED ORDER — TIZANIDINE HCL 2 MG PO CAPS: 2.0000 mg | ORAL_CAPSULE | Freq: Every evening | ORAL | 5 refills | Status: AC | PRN

## 2022-08-21 MED ORDER — TESTOSTERONE CYPIONATE 100 MG/ML IJ SOLN: 100.0000 mg | INTRAMUSCULAR | 1 refills | Status: DC

## 2022-08-21 NOTE — Progress Notes (Signed)
Date:  08/21/2022   Name:  Seth Conley   DOB:  1981/11/09   MRN:  [id number]   Chief Complaint: hormone replacement therapy, back spasm (Wants low dose cyclobenzaprine), and Flu Vaccine  Patient is a 41 year old male who presents for a hormone replacement therapy exam. The patient reports the following problems: episodic anxiety/depression. Health maintenance has been reviewed up to date.      Lab Results  Component Value Date   NA 135 08/31/2021   K 4.5 08/31/2021   CO2 30 08/31/2021   GLUCOSE 102 (H) 08/31/2021   BUN 16 08/31/2021   CREATININE 0.88 08/31/2021   CALCIUM 9.1 08/31/2021   GFRNONAA >60 08/31/2021   Lab Results  Component Value Date   CHOL 184 02/18/2022   HDL 49 02/18/2022   LDLCALC 98 02/18/2022   TRIG 218 (H) 02/18/2022   CHOLHDL 4.2 08/31/2021   No results found for: "TSH" No results found for: "HGBA1C" No results found for: "WBC", "HGB", "HCT", "MCV", "PLT" Lab Results  Component Value Date   ALT 22 08/31/2021   AST 20 08/31/2021   ALKPHOS 38 08/31/2021   BILITOT 0.6 08/31/2021   No results found for: "25OHVITD2", "25OHVITD3", "VD25OH"   Review of Systems  Constitutional:  Negative for chills and fever.  HENT:  Negative for drooling, ear discharge, ear pain and sore throat.   Respiratory:  Negative for shortness of breath and stridor.   Cardiovascular:  Negative for chest pain, palpitations and leg swelling.  Gastrointestinal:  Negative for abdominal pain, blood in stool and nausea.  Endocrine: Negative for polydipsia and polyuria.  Genitourinary:  Negative for dysuria and frequency.  Musculoskeletal:  Positive for arthralgias and back pain. Negative for myalgias and neck pain.  Skin:  Negative for rash.  Allergic/Immunologic: Negative for environmental allergies.  Neurological:  Negative for dizziness and headaches.  Hematological:  Negative for adenopathy. Does not bruise/bleed easily.  Psychiatric/Behavioral:  Negative for  suicidal ideas. The patient is not nervous/anxious.     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    Allergies  Allergen Reactions   Amoxicillin Hives and Rash   Hydrocodone Shortness Of Breath   Sulfa Antibiotics Anaphylaxis   Clindamycin Hives   Penicillins Rash    Past Surgical History:  Procedure Laterality Date   VAGINAL HYSTERECTOMY  2015    Social History   Tobacco Use   Smoking status: Former    Types: Cigarettes    Quit date: 03/12/2019    Years since quitting: 3.4   Smokeless tobacco: Never   Tobacco comments:    using lozenges  Substance Use Topics   Alcohol use: Yes    Alcohol/week: 0.0 standard drinks of alcohol   Drug use: No     Medication list has been reviewed and updated.  Current Meds  Medication Sig   aspirin EC 81 MG tablet Take 1 tablet (81 mg total) by mouth daily. Reported on 02/06/2016   meclizine (ANTIVERT) 12.5 MG tablet Take 1 tablet (12.5 mg total) by mouth 3 (three) times daily as needed for dizziness.   Testosterone Cypionate 100 MG/ML SOLN Inject 100 mg as directed every 14 (fourteen) days.   Turmeric, Curcuma Longa, POWD 1  scoop by Does not apply route daily.   vitamin B-12 (CYANOCOBALAMIN) 1000 MCG tablet Take 1,000 mcg by mouth daily.   Vitamin D 12.5 MCG/0.25ML LIQD Take by mouth. neuro       08/21/2022   10:51 AM 02/18/2022   10:46 AM 01/09/2021    9:30 AM 04/19/2020    8:10 AM  GAD 7 : Generalized Anxiety Score  Nervous, Anxious, on Edge 2 1 1  0  Control/stop worrying 2 1 0 0  Worry too much - different things 2 1 0 0  Trouble relaxing 2 1 0 0  Restless 2 1 0 0  Easily annoyed or irritable 1 2 0 1  Afraid - awful might happen 2 1 0 0  Total GAD 7 Score 13 8 1 1    Anxiety Difficulty Very difficult Somewhat difficult  Not difficult at all       08/21/2022   10:50 AM 02/18/2022   10:45 AM 01/09/2021    9:30 AM  Depression screen PHQ 2/9  Decreased Interest 0 0 0  Down, Depressed, Hopeless 1 0 0  PHQ - 2 Score 1 0 0  Altered sleeping 3 0 1  Tired, decreased energy 2 0 0  Change in appetite 0 0 0  Feeling bad or failure about yourself  2 0 0  Trouble concentrating 3 0 0  Moving slowly or fidgety/restless 0 1 0  Suicidal thoughts 0 0 0  PHQ-9 Score 11 1 1   Difficult doing work/chores Somewhat difficult Not difficult at all Not difficult at all    BP Readings from Last 3 Encounters:  08/21/22 120/80  02/18/22 120/60  08/31/21 124/80    Physical Exam Vitals and nursing note reviewed.  HENT:     Head: Normocephalic.     Right Ear: Tympanic membrane and external ear normal.     Left Ear: Tympanic membrane and external ear normal.     Nose: Nose normal.  Eyes:     General: No scleral icterus.       Right eye: No discharge.        Left eye: No discharge.     Conjunctiva/sclera: Conjunctivae normal.     Pupils: Pupils are equal, round, and reactive to light.  Neck:     Thyroid: No thyromegaly.     Vascular: No JVD.     Trachea: No tracheal deviation.  Cardiovascular:     Heart sounds: No murmur heard.    No friction rub. No gallop.  Pulmonary:     Effort: No respiratory distress.     Breath sounds: No wheezing, rhonchi or rales.  Abdominal:     Palpations: There is no mass.     Tenderness: There is no abdominal tenderness. There is no guarding or rebound.  Musculoskeletal:        General: No tenderness. Normal range of motion.     Cervical back: Normal range of motion and neck supple.  Lymphadenopathy:     Cervical: No cervical adenopathy.  Skin:    Findings: No rash.  Neurological:     Mental Status: He is alert.     Cranial Nerves: No cranial nerve deficit.     Wt Readings from Last 3 Encounters:  08/21/22 145 lb (65.8  kg)  03/19/22 133 lb (60.3 kg)  02/18/22 136 lb (61.7 kg)    BP 120/80   Pulse 80   Ht 5\' 2"  (1.575 m)   Wt 145 lb (65.8 kg)   BMI 26.52  kg/m   Assessment and Plan:  1. Hormone replacement therapy (HRT) Chronic.  Controlled.  Stable.  We will continue testosterone cypionate 100 mg injected every 2 weeks.  We will check CMP for electrolyte GFR and transaminase levels. - Testosterone Cypionate 100 MG/ML SOLN; Inject 100 mg as directed every 14 (fourteen) days.  Dispense: 10 mL; Refill: 1 - Comprehensive Metabolic Panel (CMET)  2. Chronic low back pain, unspecified back pain laterality, unspecified whether sciatica present Chronic pain.  Consistent late.  More so at night which is interrupting sleep.  Patient has tolerated tizanidine in the past and we will resume patient's at home dosing of the lowest dose to 2 mg nightly as needed for 6 months. - tizanidine (ZANAFLEX) 2 MG capsule; Take 1 capsule (2 mg total) by mouth at bedtime as needed for muscle spasms.  Dispense: 30 capsule; Refill: 5  3. Need for immunization against influenza Discussed and administered - Flu Vaccine QUAD 33mo+IM (Fluarix, Fluzone & Alfiuria Quad PF)    Otilio Miu, MD

## 2022-08-22 LAB — COMPREHENSIVE METABOLIC PANEL
ALT: 18 IU/L (ref 0–44)
AST: 17 IU/L (ref 0–40)
Albumin/Globulin Ratio: 2 (ref 1.2–2.2)
Albumin: 4.7 g/dL (ref 4.1–5.1)
Alkaline Phosphatase: 43 IU/L — ABNORMAL LOW (ref 44–121)
BUN/Creatinine Ratio: 12 (ref 9–20)
BUN: 13 mg/dL (ref 6–24)
Bilirubin Total: 0.2 mg/dL (ref 0.0–1.2)
CO2: 23 mmol/L (ref 20–29)
Calcium: 9.4 mg/dL (ref 8.7–10.2)
Chloride: 101 mmol/L (ref 96–106)
Creatinine, Ser: 1.11 mg/dL (ref 0.76–1.27)
Globulin, Total: 2.4 g/dL (ref 1.5–4.5)
Glucose: 93 mg/dL (ref 70–99)
Potassium: 4.6 mmol/L (ref 3.5–5.2)
Sodium: 137 mmol/L (ref 134–144)
Total Protein: 7.1 g/dL (ref 6.0–8.5)
eGFR: 86 mL/min/{1.73_m2} (ref >59)

## 2023-02-19 ENCOUNTER — Encounter: Payer: Self-pay | Admitting: Family Medicine

## 2023-02-19 ENCOUNTER — Ambulatory Visit (INDEPENDENT_AMBULATORY_CARE_PROVIDER_SITE_OTHER): Payer: 59 | Admitting: Family Medicine

## 2023-02-19 VITALS — BP 124/94 | HR 72 | Ht 62.0 in | Wt 142.0 lb

## 2023-02-19 DIAGNOSIS — Z79890 Hormone replacement therapy (postmenopausal): Secondary | ICD-10-CM | POA: Diagnosis not present

## 2023-02-19 MED ORDER — TESTOSTERONE CYPIONATE 200 MG/ML IM SOLN: 100.0000 mg | INTRAMUSCULAR | 2 refills | Status: DC

## 2023-02-19 NOTE — Progress Notes (Signed)
Date:  02/19/2023   Name:  Seth Conley   DOB:  08/08/1981   MRN:  [id number]   Chief Complaint: hormone replacement therapy  Patient is a 42 year old male who presents for a medication refill exam. The patient reports the following problems: none related. Health maintenance has been reviewed up to date.      Lab Results  Component Value Date   NA 137 08/21/2022   K 4.6 08/21/2022   CO2 23 08/21/2022   GLUCOSE 93 08/21/2022   BUN 13 08/21/2022   CREATININE 1.11 08/21/2022   CALCIUM 9.4 08/21/2022   EGFR 86 08/21/2022   GFRNONAA >60 08/31/2021   Lab Results  Component Value Date   CHOL 184 02/18/2022   HDL 49 02/18/2022   LDLCALC 98 02/18/2022   TRIG 218 (H) 02/18/2022   CHOLHDL 4.2 08/31/2021   No results found for: "TSH" No results found for: "HGBA1C" No results found for: "WBC", "HGB", "HCT", "MCV", "PLT" Lab Results  Component Value Date   ALT 18 08/21/2022   AST 17 08/21/2022   ALKPHOS 43 (L) 08/21/2022   BILITOT 0.2 08/21/2022   No results found for: "25OHVITD2", "25OHVITD3", "VD25OH"   Review of Systems  HENT:  Negative for trouble swallowing.   Eyes:  Negative for visual disturbance.  Respiratory:  Negative for cough, chest tightness, shortness of breath and wheezing.   Cardiovascular:  Negative for chest pain and palpitations.  Gastrointestinal:  Negative for abdominal pain and blood in stool.  Endocrine: Negative for polydipsia and polyuria.  Genitourinary:  Negative for difficulty urinating.    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    Allergies  Allergen Reactions   Amoxicillin Hives and Rash   Hydrocodone Shortness Of Breath   Sulfa Antibiotics Anaphylaxis    Clindamycin Hives   Penicillins Rash    Past Surgical History:  Procedure Laterality Date   VAGINAL HYSTERECTOMY  2015    Social History   Tobacco Use   Smoking status: Former    Types: Cigarettes    Quit date: 03/12/2019    Years since quitting: 3.9   Smokeless tobacco: Never   Tobacco comments:    using lozenges  Substance Use Topics   Alcohol use: Yes    Alcohol/week: 0.0 standard drinks of alcohol   Drug use: No     Medication list has been reviewed and updated.  Current Meds  Medication Sig   ASHWAGANDHA PO Take 200 mg by mouth daily.   aspirin EC 81 MG tablet Take 1 tablet (81 mg total) by mouth daily. Reported on 02/06/2016   meclizine (ANTIVERT) 12.5 MG tablet Take 1 tablet (12.5 mg total) by mouth 3 (three) times daily as needed for dizziness.   Testosterone Cypionate 100 MG/ML SOLN Inject 100 mg as directed every 14 (fourteen) days.   tizanidine (ZANAFLEX) 2 MG capsule Take 1 capsule (2 mg total) by mouth at bedtime as needed for muscle spasms.   vitamin B-12 (CYANOCOBALAMIN) 1000 MCG tablet Take 1,000 mcg by mouth daily.   Vitamin D 12.5 MCG/0.25ML LIQD Take by mouth. neuro       02/19/2023   10:09 AM 08/21/2022   10:51 AM 02/18/2022   10:46 AM  01/09/2021    9:30 AM  GAD 7 : Generalized Anxiety Score  Nervous, Anxious, on Edge 0 2 1 1   Control/stop worrying 0 2 1 0  Worry too much - different things 0 2 1 0  Trouble relaxing 0 2 1 0  Restless 0 2 1 0  Easily annoyed or irritable 0 1 2 0  Afraid - awful might happen 0 2 1 0  Total GAD 7 Score 0 13 8 1   Anxiety Difficulty Not difficult at all Very difficult Somewhat difficult        02/19/2023   10:08 AM 08/21/2022   10:50 AM 02/18/2022   10:45 AM  Depression screen PHQ 2/9  Decreased Interest 0 0 0  Down, Depressed, Hopeless 0 1 0  PHQ - 2 Score 0 1 0  Altered sleeping 0 3 0  Tired, decreased energy 0 2 0  Change in appetite 0 0 0  Feeling bad or failure about yourself  0 2 0  Trouble concentrating  0 3 0  Moving slowly or fidgety/restless 0 0 1  Suicidal thoughts 0 0 0  PHQ-9 Score 0 11 1  Difficult doing work/chores Not difficult at all Somewhat difficult Not difficult at all    BP Readings from Last 3 Encounters:  02/19/23 (!) 124/94  08/21/22 120/80  02/18/22 120/60    Physical Exam HENT:     Head: Normocephalic.     Right Ear: Tympanic membrane, ear canal and external ear normal.     Left Ear: Tympanic membrane, ear canal and external ear normal.     Nose: Nose normal.     Mouth/Throat:     Mouth: Mucous membranes are moist.     Pharynx: No oropharyngeal exudate or posterior oropharyngeal erythema.  Eyes:     Pupils: Pupils are equal, round, and reactive to light.  Cardiovascular:     Rate and Rhythm: Normal rate.     Heart sounds: No murmur heard.    No friction rub. No gallop.  Pulmonary:     Breath sounds: No wheezing, rhonchi or rales.  Abdominal:     Palpations: There is no hepatomegaly or splenomegaly.     Tenderness: There is no abdominal tenderness.  Musculoskeletal:     Cervical back: Normal range of motion.  Neurological:     Mental Status: He is alert.     Wt Readings from Last 3 Encounters:  02/19/23 142 lb (64.4 kg)  08/21/22 145 lb (65.8 kg)  03/19/22 133 lb (60.3 kg)    BP (!) 124/94 (BP Location: Right Arm, Cuff Size: Large)   Pulse 72   Ht 5\' 2"  (1.575 m)   Wt 142 lb (64.4 kg)   SpO2 99%   BMI 25.97 kg/m   Assessment and Plan:  1. Hormone replacement therapy (HRT) Chronic.  Controlled.  Stable.  Patient will continue at current dosing of 100 mg (1/2 cc) every 14 days.  Will continue at current dosing and will recheck in 6 months   Otilio Miu, MD

## 2023-07-07 ENCOUNTER — Telehealth: Payer: Self-pay | Admitting: Family Medicine

## 2023-07-07 NOTE — Telephone Encounter (Unsigned)
Copied from CRM [id number]. Topic: Referral - Request for Referral >> Jul 07, 2023  3:21 PM Dondra Prader A wrote: Reason for CRM: Endocrinologists Referral  Has patient seen PCP for this complaint? No. *If NO, is insurance requiring patient see PCP for this issue before PCP can refer them? Referral for which specialty: Endocrinology Preferred provider/office: [facility] Endocrinology [address]  [place], [place] [address] [phone]  Reason for referral: Pt states that he was in the ER last week and was told he need  a referral to an Endocrinologists because of his thyroids and pt is needing a stress test.

## 2023-07-08 ENCOUNTER — Telehealth: Payer: Self-pay

## 2023-07-08 NOTE — Transitions of Care (Post Inpatient/ED Visit) (Unsigned)
   07/08/2023  Name: Virlyn Timmer MRN: [id number] DOB: 1981/09/08  Today's TOC FU Call Status: Today's TOC FU Call Status:: Unsuccessful Call (1st Attempt) Unsuccessful Call (1st Attempt) Date: 07/08/23  Attempted to reach the patient regarding the most recent Inpatient/ED visit.  Follow Up Plan: Additional outreach attempts will be made to reach the patient to complete the Transitions of Care (Post Inpatient/ED visit) call.   Signature ***

## 2023-07-09 NOTE — Transitions of Care (Post Inpatient/ED Visit) (Unsigned)
   07/15/2023  Name: Seth Conley MRN: [id number] DOB: [birth year]  Today's TOC FU Call Status: Today's TOC FU Call Status:: Successful TOC FU Call Completed Unsuccessful Call (1st Attempt) Date: 07/08/23 [facility] FU Call Complete Date: 07/09/23  Transition Care Management Follow-up Telephone Call Date of Discharge: 07/04/23 Discharge Facility: Other ([facility] Facility) Name of Other ([facility]) Discharge Facility: [facility] Type of Discharge: Emergency Department How have you been since you were released from the hospital?: Better Any questions or concerns?: No  Items Reviewed: Did you receive and understand the discharge instructions provided?: Yes Medications obtained,verified, and reconciled?: Yes (Medications Reviewed) Any new allergies since your discharge?: No Dietary orders reviewed?: NA Do you have support at home?: Yes People in Home: alone  Medications Reviewed Today: Medications Reviewed Today   Medications were not reviewed in this encounter     Home Care and Equipment/Supplies: Were Home Health Services Ordered?: No Any new equipment or medical supplies ordered?: No  Functional Questionnaire: Do you need assistance with bathing/showering or dressing?: No Do you need assistance with meal preparation?: No Do you need assistance with eating?: No Do you have difficulty maintaining continence: No Do you need assistance with getting out of bed/getting out of a chair/moving?: No Do you have difficulty managing or taking your medications?: No  Follow up appointments reviewed: PCP Follow-up appointment confirmed?: Yes Specialist Hospital Follow-up appointment confirmed?: Yes Do you need transportation to your follow-up appointment?: No Do you understand care options if your condition(s) worsen?: Yes-patient verbalized understanding    SIGNATURE TB,CMA

## 2023-07-10 ENCOUNTER — Encounter: Payer: Self-pay | Admitting: Family Medicine

## 2023-07-10 ENCOUNTER — Ambulatory Visit: Payer: 59 | Admitting: Family Medicine

## 2023-07-10 VITALS — BP 110/70 | HR 94 | Ht 62.0 in | Wt 140.0 lb

## 2023-07-10 DIAGNOSIS — R7989 Other specified abnormal findings of blood chemistry: Secondary | ICD-10-CM | POA: Diagnosis not present

## 2023-07-10 DIAGNOSIS — Z79890 Hormone replacement therapy (postmenopausal): Secondary | ICD-10-CM | POA: Diagnosis not present

## 2023-07-10 NOTE — Progress Notes (Signed)
Date:  07/10/2023   Name:  Seth Conley   DOB:  [birth year]   MRN:  [id number]   Chief Complaint: endo ref (Thyroid and cont HRT)  Patient is a 42 year old male who presents for a HRT exam. The patient reports the following problems: decreased TSH/normal T4. Health maintenance has been reviewed up to date.      Lab Results  Component Value Date   NA 137 08/21/2022   K 4.6 08/21/2022   CO2 23 08/21/2022   GLUCOSE 93 08/21/2022   BUN 13 08/21/2022   CREATININE 1.11 08/21/2022   CALCIUM 9.4 08/21/2022   EGFR 86 08/21/2022   GFRNONAA >60 08/31/2021   Lab Results  Component Value Date   CHOL 184 02/18/2022   HDL 49 02/18/2022   LDLCALC 98 02/18/2022   TRIG 218 (H) 02/18/2022   CHOLHDL 4.2 08/31/2021   No results found for: "TSH" No results found for: "HGBA1C" No results found for: "WBC", "HGB", "HCT", "MCV", "PLT" Lab Results  Component Value Date   ALT 18 08/21/2022   AST 17 08/21/2022   ALKPHOS 43 (L) 08/21/2022   BILITOT 0.2 08/21/2022   No results found for: "25OHVITD2", "25OHVITD3", "VD25OH"   Review of Systems  Constitutional:  Negative for chills and fever.  HENT:  Negative for drooling, ear discharge, ear pain, rhinorrhea, sore throat and trouble swallowing.   Eyes:  Negative for visual disturbance.  Respiratory:  Negative for cough, chest tightness, shortness of breath and wheezing.   Cardiovascular:  Positive for palpitations. Negative for chest pain and leg swelling.  Gastrointestinal:  Positive for constipation and diarrhea. Negative for abdominal pain, blood in stool and nausea.  Endocrine: Negative for polydipsia.  Genitourinary:  Positive for frequency. Negative for difficulty urinating, dysuria, hematuria and urgency.  Musculoskeletal:  Negative for back pain, myalgias and neck pain.  Skin:  Negative for rash.  Allergic/Immunologic: Negative for environmental allergies.  Neurological:  Negative for dizziness and headaches.  Hematological:   Does not bruise/bleed easily.  Psychiatric/Behavioral:  Negative for suicidal ideas. The patient is not nervous/anxious.     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    Allergies  Allergen Reactions   Amoxicillin Hives and Rash   Hydrocodone Shortness Of Breath   Sulfa Antibiotics Anaphylaxis   Clindamycin Hives   Penicillins Rash    Past Surgical History:  Procedure Laterality Date   VAGINAL HYSTERECTOMY  2015    Social History   Tobacco Use   Smoking status: Former    Current packs/day: 0.00    Types: Cigarettes    Quit date: 03/12/2019    Years since quitting: 4.3   Smokeless tobacco: Never   Tobacco comments:    using lozenges  Substance Use Topics   Alcohol use: Yes    Alcohol/week: 0.0 standard drinks of alcohol   Drug use: No     Medication list has been reviewed and updated.  Current Meds  Medication Sig   ASHWAGANDHA PO Take 200 mg by mouth daily.   aspirin EC 81 MG tablet Take 1 tablet (81 mg total) by mouth daily. Reported on 02/06/2016   meclizine (ANTIVERT) 12.5 MG tablet Take 1 tablet (12.5 mg total) by mouth  3 (three) times daily as needed for dizziness.   testosterone cypionate (DEPOTESTOSTERONE CYPIONATE) 200 MG/ML injection Inject 0.5 mLs (100 mg total) into the muscle every 14 (fourteen) days.   Testosterone Cypionate 100 MG/ML SOLN Inject 100 mg as directed every 14 (fourteen) days.   tizanidine (ZANAFLEX) 2 MG capsule Take 1 capsule (2 mg total) by mouth at bedtime as needed for muscle spasms.   vitamin B-12 (CYANOCOBALAMIN) 1000 MCG tablet Take 1,000 mcg by mouth daily.   Vitamin D 12.5 MCG/0.25ML LIQD Take by mouth. neuro       07/10/2023    8:52 AM 02/19/2023   10:09 AM  08/21/2022   10:51 AM 02/18/2022   10:46 AM  GAD 7 : Generalized Anxiety Score  Nervous, Anxious, on Edge 0 0 2 1  Control/stop worrying 0 0 2 1  Worry too much - different things 0 0 2 1  Trouble relaxing 0 0 2 1  Restless 0 0 2 1  Easily annoyed or irritable 0 0 1 2  Afraid - awful might happen 0 0 2 1  Total GAD 7 Score 0 0 13 8  Anxiety Difficulty Not difficult at all Not difficult at all Very difficult Somewhat difficult       07/10/2023    8:52 AM 02/19/2023   10:08 AM 08/21/2022   10:50 AM  Depression screen PHQ 2/9  Decreased Interest 0 0 0  Down, Depressed, Hopeless 0 0 1  PHQ - 2 Score 0 0 1  Altered sleeping 0 0 3  Tired, decreased energy 0 0 2  Change in appetite 0 0 0  Feeling bad or failure about yourself  0 0 2  Trouble concentrating 0 0 3  Moving slowly or fidgety/restless 0 0 0  Suicidal thoughts 0 0 0  PHQ-9 Score 0 0 11  Difficult doing work/chores Not difficult at all Not difficult at all Somewhat difficult    BP Readings from Last 3 Encounters:  07/10/23 110/70  02/19/23 (!) 124/94  08/21/22 120/80    Physical Exam Vitals and nursing note reviewed.  HENT:     Head: Normocephalic.     Right Ear: Tympanic membrane and ear canal normal. There is no impacted cerumen.     Left Ear: Tympanic membrane and ear canal normal. There is no impacted cerumen.     Nose: Nose normal.     Mouth/Throat:     Mouth: Mucous membranes are moist.  Eyes:     Extraocular Movements: Extraocular movements intact.     Pupils: Pupils are equal, round, and reactive to light.  Neck:     Thyroid: No thyroid mass, thyromegaly or thyroid tenderness.     Trachea: Trachea normal.  Cardiovascular:     Rate and Rhythm: Tachycardia present.     Heart sounds: No murmur heard.    No friction rub. No gallop.  Pulmonary:     Breath sounds: No wheezing, rhonchi or rales.  Abdominal:     Palpations: Abdomen is soft.  Neurological:     Mental Status: He is alert.     Wt Readings  from Last 3 Encounters:  07/10/23 140 lb (63.5 kg)  02/19/23 142 lb (64.4 kg)  08/21/22 145 lb (65.8 kg)    BP 110/70 (BP Location: Right Arm, Cuff Size: Large)   Pulse 94   Ht 5\' 2"  (1.575 m)   Wt 140 lb (63.5 kg)   SpO2 98%   BMI 25.61 kg/m   Assessment  and Plan:  1. Hormone replacement therapy (HRT) Chronic.  Controlled.  Stable.  Currently is on testosterone replacement 100 mg every 14 days and tolerating well.  However because they did not have the 100 mg/mL patient was given 200 mg and he has been instructed to take half of that dose for the 100 mg dosing that he was taking previous.  Patient will be referred to endocrinology for follow-up in the future as that I will be retiring and he would like an endocrinologist involved with this concern as well as to 1 below. - Ambulatory referral to Endocrinology  2. Abnormal TSH During hospitalization for atypical chest pain it was noted that patient had a slightly decreased TSH with normal T4.  I think for the most part patient has been asymptomatic except occasionally has palpitations.  We will refer to endocrinology for further the evaluation of the abnormal TSH and for future monitoring. - Ambulatory referral to Endocrinology    Elizabeth Sauer, MD

## 2023-08-22 ENCOUNTER — Ambulatory Visit: Payer: 59 | Admitting: Family Medicine

## 2023-12-23 ENCOUNTER — Ambulatory Visit: Payer: Medicaid Other | Admitting: Family Medicine

## 2024-01-02 ENCOUNTER — Ambulatory Visit: Payer: Medicaid Other | Admitting: Family Medicine

## 2024-01-02 VITALS — BP 118/74 | HR 80 | Temp 97.8°F | Ht 62.0 in | Wt 152.0 lb

## 2024-01-02 DIAGNOSIS — J0100 Acute maxillary sinusitis, unspecified: Secondary | ICD-10-CM | POA: Diagnosis not present

## 2024-01-02 DIAGNOSIS — Z79890 Hormone replacement therapy (postmenopausal): Secondary | ICD-10-CM

## 2024-01-02 MED ORDER — TESTOSTERONE CYPIONATE 200 MG/ML IM SOLN: 100.0000 mg | INTRAMUSCULAR | 2 refills | Status: AC

## 2024-01-02 MED ORDER — DOXYCYCLINE HYCLATE 100 MG PO TABS: 100.0000 mg | ORAL_TABLET | Freq: Two times a day (BID) | ORAL | 1 refills | Status: AC

## 2024-01-02 MED ORDER — DOXYCYCLINE HYCLATE 100 MG PO TABS: 100.0000 mg | ORAL_TABLET | Freq: Two times a day (BID) | ORAL | 1 refills | Status: DC

## 2024-01-02 NOTE — Progress Notes (Signed)
Date:  01/02/2024   Name:  Seth Conley   DOB:  03-Aug-1981   MRN:  [id number]   Chief Complaint: HRT and Sinusitis (Sinus pressure, congestion, and ears clogged up. )  Patient is a 43 year old male who presents for a comprehensive physical exam. The patient reports the following problems: HRT. Health maintenance has been reviewed up to date.    Sinusitis This is a chronic problem. The current episode started more than 1 year ago. The problem has been gradually improving since onset. There has been no fever. The pain is mild. Associated symptoms include congestion, coughing, headaches, sinus pressure and a sore throat. Pertinent negatives include no chills, diaphoresis, ear pain, hoarse voice, neck pain, shortness of breath, sneezing or swollen glands. Past treatments include nothing. The treatment provided mild relief.    Lab Results  Component Value Date   NA 137 08/21/2022   K 4.6 08/21/2022   CO2 23 08/21/2022   GLUCOSE 93 08/21/2022   BUN 13 08/21/2022   CREATININE 1.11 08/21/2022   CALCIUM 9.4 08/21/2022   EGFR 86 08/21/2022   GFRNONAA >60 08/31/2021   Lab Results  Component Value Date   CHOL 184 02/18/2022   HDL 49 02/18/2022   LDLCALC 98 02/18/2022   TRIG 218 (H) 02/18/2022   CHOLHDL 4.2 08/31/2021   No results found for: "TSH" No results found for: "HGBA1C" No results found for: "WBC", "HGB", "HCT", "MCV", "PLT" Lab Results  Component Value Date   ALT 18 08/21/2022   AST 17 08/21/2022   ALKPHOS 43 (L) 08/21/2022   BILITOT 0.2 08/21/2022   No results found for: "25OHVITD2", "25OHVITD3", "VD25OH"   Review of Systems  Constitutional:  Negative for chills and diaphoresis.  HENT:  Positive for congestion, sinus pressure and sore throat. Negative for ear pain, hoarse voice and sneezing.   Eyes:  Negative for visual disturbance.  Respiratory:  Positive for cough. Negative for choking, chest tightness, shortness of breath, wheezing and stridor.    Cardiovascular:  Negative for chest pain, palpitations and leg swelling.  Gastrointestinal:  Negative for abdominal distention.  Musculoskeletal:  Negative for neck pain.  Neurological:  Positive for headaches.    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    Allergies  Allergen Reactions   Amoxicillin Hives and Rash   Hydrocodone Shortness Of Breath   Sulfa Antibiotics Anaphylaxis   Clindamycin Hives   Penicillins Rash    Past Surgical History:  Procedure Laterality Date   VAGINAL HYSTERECTOMY  2015    Social History   Tobacco Use   Smoking status: Former    Current packs/day: 0.00    Types: Cigarettes    Quit date: 03/12/2019    Years since quitting: 4.8   Smokeless tobacco: Never   Tobacco comments:    using lozenges  Substance Use Topics   Alcohol use: Yes    Alcohol/week: 0.0 standard drinks of alcohol   Drug use: No     Medication list has been reviewed and updated.  Current Meds  Medication Sig   buPROPion (WELLBUTRIN XL) 150 MG 24 hr tablet Take 150 mg by mouth every morning.   meclizine (ANTIVERT) 12.5 MG tablet Take 1 tablet (12.5 mg  total) by mouth 3 (three) times daily as needed for dizziness.   testosterone cypionate (DEPOTESTOSTERONE CYPIONATE) 200 MG/ML injection Inject 0.5 mLs (100 mg total) into the muscle every 14 (fourteen) days.   Testosterone Cypionate 100 MG/ML SOLN Inject 100 mg as directed every 14 (fourteen) days.   tizanidine (ZANAFLEX) 2 MG capsule Take 1 capsule (2 mg total) by mouth at bedtime as needed for muscle spasms.   vitamin B-12 (CYANOCOBALAMIN) 1000 MCG tablet Take 1,000 mcg by mouth daily.   Vitamin D 12.5 MCG/0.25ML LIQD Take by mouth. neuro       01/02/2024     9:43 AM 07/10/2023    8:52 AM 02/19/2023   10:09 AM 08/21/2022   10:51 AM  GAD 7 : Generalized Anxiety Score  Nervous, Anxious, on Edge 0 0 0 2  Control/stop worrying 0 0 0 2  Worry too much - different things 0 0 0 2  Trouble relaxing 0 0 0 2  Restless 0 0 0 2  Easily annoyed or irritable 0 0 0 1  Afraid - awful might happen 0 0 0 2  Total GAD 7 Score 0 0 0 13  Anxiety Difficulty Not difficult at all Not difficult at all Not difficult at all Very difficult       01/02/2024    9:43 AM 07/10/2023    8:52 AM 02/19/2023   10:08 AM  Depression screen PHQ 2/9  Decreased Interest 0 0 0  Down, Depressed, Hopeless 0 0 0  PHQ - 2 Score 0 0 0  Altered sleeping 0 0 0  Tired, decreased energy 0 0 0  Change in appetite 0 0 0  Feeling bad or failure about yourself  0 0 0  Trouble concentrating 0 0 0  Moving slowly or fidgety/restless 0 0 0  Suicidal thoughts 0 0 0  PHQ-9 Score 0 0 0  Difficult doing work/chores Not difficult at all Not difficult at all Not difficult at all    BP Readings from Last 3 Encounters:  01/02/24 118/74  07/10/23 110/70  02/19/23 (!) 124/94    Physical Exam Vitals and nursing note reviewed.  Constitutional:      Appearance: He is well-developed.  HENT:     Head: Normocephalic and atraumatic.     Right Ear: Tympanic membrane, ear canal and external ear normal.     Left Ear: Tympanic membrane, ear canal and external ear normal.     Nose: Nose normal.     Mouth/Throat:     Mouth: Mucous membranes are moist.     Dentition: Normal dentition.     Pharynx: No posterior oropharyngeal erythema.  Eyes:     General: Lids are normal. No scleral icterus.    Conjunctiva/sclera: Conjunctivae normal.     Pupils: Pupils are equal, round, and reactive to light.  Neck:     Thyroid: No thyromegaly.     Vascular: No carotid bruit, hepatojugular reflux or JVD.     Trachea: No tracheal deviation.  Cardiovascular:     Rate and Rhythm: Normal rate and regular rhythm.      Heart sounds: Normal heart sounds. No murmur heard.    No friction rub. No gallop.  Pulmonary:     Effort: Pulmonary effort is normal. No respiratory distress.     Breath sounds: Normal breath sounds. No wheezing, rhonchi or rales.  Abdominal:     General: Bowel sounds are normal.     Palpations: Abdomen is soft. There is no  hepatomegaly, splenomegaly or mass.     Tenderness: There is no abdominal tenderness.     Hernia: There is no hernia in the left inguinal area.  Musculoskeletal:        General: Normal range of motion.     Cervical back: Normal range of motion and neck supple.  Lymphadenopathy:     Cervical: No cervical adenopathy.  Skin:    General: Skin is warm and dry.     Findings: No rash.  Neurological:     Mental Status: He is alert and oriented to person, place, and time.     Sensory: No sensory deficit.     Deep Tendon Reflexes: Reflexes are normal and symmetric.  Psychiatric:        Mood and Affect: Mood is not anxious or depressed.     Wt Readings from Last 3 Encounters:  01/02/24 152 lb (68.9 kg)  07/10/23 140 lb (63.5 kg)  02/19/23 142 lb (64.4 kg)    BP 118/74   Pulse 80   Temp 97.8 F (36.6 C) (Oral)   Ht 5\' 2"  (1.575 m)   Wt 152 lb (68.9 kg)   SpO2 100%   BMI 27.80 kg/m   Assessment and Plan: 1. Hormone replacement therapy (HRT) (Primary) Chronic.  Controlled.  Stable.  Patient is currently on replacement for affirmation with testosterone cypionate 200/ML is currently on 100 mg every 14 days (1/2 cc).  Most recently had a peak and trough level done and awaiting results from there in the meantime we will continue him on the current dosing 400 mg every 2 weeks  2. Acute maxillary sinusitis, recurrence not specified New onset.  Persistent.  Stable.  Symptomatology and objectivity with tenderness over the maxillary sinuses onset in the past 48 hours is consistent with a acute sinusitis the maxillary and we will initiate doxycycline 100 mg twice a  day. - doxycycline (VIBRA-TABS) 100 MG tablet; Take 1 tablet (100 mg total) by mouth 2 (two) times daily.  Dispense: 20 tablet; Refill: 1     Elizabeth Sauer, MD

## 2024-07-15 ENCOUNTER — Telehealth (HOSPITAL_BASED_OUTPATIENT_CLINIC_OR_DEPARTMENT_OTHER): Payer: Self-pay

## 2024-07-15 NOTE — Telephone Encounter (Signed)
 error
